# Patient Record
Sex: Female | Born: 1945 | Race: White | Hispanic: No | Marital: Married | Smoking: Never smoker
Health system: Southern US, Community
[De-identification: ages and names within clinical notes are randomized; demographics above are authoritative.]

## PROBLEM LIST (undated history)

## (undated) DIAGNOSIS — M1990 Unspecified osteoarthritis, unspecified site: Secondary | ICD-10-CM

## (undated) DIAGNOSIS — D759 Disease of blood and blood-forming organs, unspecified: Secondary | ICD-10-CM

## (undated) DIAGNOSIS — R079 Chest pain, unspecified: Secondary | ICD-10-CM

## (undated) DIAGNOSIS — C801 Malignant (primary) neoplasm, unspecified: Secondary | ICD-10-CM

## (undated) DIAGNOSIS — E039 Hypothyroidism, unspecified: Secondary | ICD-10-CM

## (undated) HISTORY — PX: CATARACT EXTRACTION, BILATERAL: SHX1313

## (undated) HISTORY — PX: JOINT REPLACEMENT: SHX530

## (undated) HISTORY — PX: TUBAL LIGATION: SHX77

## (undated) HISTORY — PX: MELANOMA EXCISION: SHX5266

---

## 1978-06-20 DIAGNOSIS — G039 Meningitis, unspecified: Secondary | ICD-10-CM

## 1978-06-20 HISTORY — DX: Meningitis, unspecified: G03.9

## 2012-10-24 ENCOUNTER — Telehealth: Payer: Self-pay | Admitting: Oncology

## 2012-10-24 NOTE — Telephone Encounter (Signed)
S/W PT IN RE TO NP APPT 06/23 @ 2 W/DR. MAGRINAT REFERRING DR. Windy Fast GIOFFRE WELCOME PACKET MAILED.

## 2012-10-24 NOTE — Telephone Encounter (Signed)
PER MD SCHEDULED PT 06/23 @ 2.

## 2012-10-26 ENCOUNTER — Telehealth: Payer: Self-pay | Admitting: Oncology

## 2012-10-26 NOTE — Telephone Encounter (Signed)
C/D 10/26/12 for appt. 12/10/12

## 2012-10-27 ENCOUNTER — Other Ambulatory Visit: Payer: Self-pay | Admitting: Oncology

## 2012-10-27 DIAGNOSIS — D472 Monoclonal gammopathy: Secondary | ICD-10-CM

## 2012-10-30 ENCOUNTER — Telehealth: Payer: Self-pay | Admitting: *Deleted

## 2012-10-30 NOTE — Telephone Encounter (Signed)
Pt left message with this RN stating desire to obtain labs if possible at facility in [place]. Timmy left names of 3 facilities - [facility], her primary MD- DR Junious Dresser and [facility].  Arrangements made for labs to be drawn at [facility] on [address]/ ph# [phone], fax# [phone].

## 2012-12-05 ENCOUNTER — Other Ambulatory Visit: Payer: Self-pay | Admitting: Lab

## 2012-12-05 ENCOUNTER — Telehealth: Payer: Self-pay | Admitting: *Deleted

## 2012-12-05 NOTE — Telephone Encounter (Signed)
Received call from Dardanelle @ [facility] in [place] needing clarifications again  on lab orders as pt is waiting at the lab today.  Katharine Look all lab orders as scheduled in EPIC.  Lisa's phone  [phone].

## 2012-12-10 ENCOUNTER — Encounter: Payer: Self-pay | Admitting: Oncology

## 2012-12-10 ENCOUNTER — Other Ambulatory Visit: Payer: Self-pay | Admitting: Lab

## 2012-12-10 ENCOUNTER — Ambulatory Visit (HOSPITAL_BASED_OUTPATIENT_CLINIC_OR_DEPARTMENT_OTHER): Payer: Medicare Other

## 2012-12-10 ENCOUNTER — Telehealth: Payer: Self-pay | Admitting: Oncology

## 2012-12-10 ENCOUNTER — Ambulatory Visit (HOSPITAL_BASED_OUTPATIENT_CLINIC_OR_DEPARTMENT_OTHER): Payer: Medicare Other | Admitting: Oncology

## 2012-12-10 VITALS — BP 126/74 | HR 52 | Temp 97.8°F | Resp 20 | Ht 61.0 in | Wt 155.7 lb

## 2012-12-10 DIAGNOSIS — D472 Monoclonal gammopathy: Secondary | ICD-10-CM | POA: Insufficient documentation

## 2012-12-10 NOTE — Progress Notes (Signed)
ID: Madison Alvarez OB: 18-Apr-1946  MR#: [id number]  CSN#:[id number]  PCP: No primary provider on file. GYN:  Madison Alvarez SU:  OTHER MD: Madison Alvarez (PCP), Madison Alvarez,   HISTORY OF PRESENT ILLNESS: Madison Alvarez noted left hip pain sometime in January of 2014. This became progressively worse, to the point that she was "limping everywhere". She brought this to the attention of her Alvarez-in-law, who is a [place], but he was not able to relieve the problem and suggested some films. She then was evaluated by Dr. Tinnie Gens who obtained a left hip MRI and bone scan. The left hip MRI showed a nondisplaced subchondral stress fracture of the left femoral head with mild to moderate marrow edema. There was moderate chronic arthritis of both hips and incidentally sigmoid diverticulosis was also noted. As part of the evaluation lab work was obtained and this showed an M spike of 0.46 g/dL, identified as IgG lambda. Other labs obtained at that time showed a total IgG of 1180, total IgA of 171, and total IgM of 93, all normal. Hemoglobin was 13.0, MCV 87.2, creatinine 0.72 and calcium 9.4. Albumin was 4.2 and total protein 7.0. Again all these lab work was unremarkable.  Given the identification of an M spike and the patient was referred for further evaluation  INTERVAL HISTORY: Madison Alvarez was seen at the cancer clinic 12/10/2012 accompanied by her husband Madison Alvarez  REVIEW OF SYSTEMS: Her hip pain gradually got better over the last 6 weeks and now it doesn't hurt. She has however not started back on a regular exercise program. She has had no fevers, rash, bleeding, unexplained fatigue or unexplained weight loss. A detailed review of systems today was entirely benign  PAST MEDICAL HISTORY: No past medical history on file. She has a remote history of meningitis and carries a diagnosis of osteoporosis  PAST SURGICAL HISTORY: No past surgical history on file. Melanoma removed from the left hip skin area approximately  2005  FAMILY HISTORY No family history on file. The patient's father committed suicide when the patient was 67 years old. The patient's mother died from complications of epilepsy her in her late 81s. The patient has 2 brothers, one sister. There is no history of cancer in the family.  GYNECOLOGIC HISTORY:  Menarche age 67, first live birth age 67, the patient is GX P2. She stopped having periods approximately 2006. She never took hormone replacement  SOCIAL HISTORY:  Madison Alvarez works at "[employer]" in [place] she has 2 children from her first marriage Madison Alvarez who lives in [facility] [place] and works as a Cabin crew, also living in [place], who works in Administrator, arts. The patient has 3 grandchildren. Madison Alvarez has 2 children from a prior marriage a Alvarez living in [place] and a Alvarez living in [place]. Madison Alvarez has 2 grandchildren of his own. The Kains are not church attenders    ADVANCED DIRECTIVES:    HEALTH MAINTENANCE: History  Substance Use Topics  . Smoking status: Not on file  . Smokeless tobacco: Not on file  . Alcohol Use: Not on file     Colonoscopy: 2013  PAP: Due  Bone density: 2012, showing "early osteoporosis"  Lipid panel:  Allergies not on file  No current outpatient prescriptions on file.   No current facility-administered medications for this visit.    OBJECTIVE:  Middle-aged white woman in no acute distress Filed Vitals:   12/10/12 1420  BP: 126/74  Pulse: 52  Temp: 97.8 F (36.6 C)  Resp:  20     Body mass index is 29.43 kg/(m^2).    ECOG FS: 0  Sclerae unicteric Oropharynx clear No cervical or supraclavicular adenopathy Lungs no rales or rhonchi Heart regular rate and rhythm Abd benign MSK no focal spinal tenderness, no peripheral edema; specifically no pain to palpation or percussion over the left hip area Neuro: non-focal, well-oriented, appropriate affect Breasts: Deferred   LAB RESULTS:  CMP  No results found for this basename:  na, k, cl, co2, glucose, bun, creatinine, calcium, prot, albumin, ast, alt, alkphos, bilitot, gfrnonaa, gfraa    I No results found for this basename: SPEP, UPEP,  kappa and lambda light chains    No results found for this basename: WBC, NEUTROABS, HGB, HCT, MCV, PLT      Chemistry   No results found for this basename: NA, K, CL, CO2, BUN, CREATININE, GLU   No results found for this basename: CALCIUM, ALKPHOS, AST, ALT, BILITOT       No results found for this basename: LABCA2    No components found with this basename: LABCA125    No results found for this basename: INR,  in the last 168 hours  Urinalysis No results found for this basename: colorurine, appearanceur, labspec, phurine, glucoseu, hgbur, bilirubinur, ketonesur, proteinur, urobilinogen, nitrite, leukocytesur    STUDIES: No results found.  ASSESSMENT: 67 y.o. [place] woman with a 0.46 g/dL serum monoclonal spike, IgG lambda, documented 10/01/2012 in the absence of urine paraproteinemia, anemia, hypercalcemia, renal injury or lytic bone leasions  PLAN: We spent the better part of today's hour-long visit going over the biology of plasma cells and the difference between monoclonal gammopathy of uncertain significance and multiple myeloma. Madison Alvarez has a very small M.-spike in the absence of any evidence of an organ damage. 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  I have set her up for lab work in October, February, and June, and she will see me on a yearly basis a week or so after her June lab work. We will do the labs in [place] to save her the trip to [place]. She has access to "my chart" and therefore will have her results available, but will call with any questions. After the first year we will brought in the followup interval to every 6 months.   Lowella Dell, MD   12/10/2012 3:25 PM

## 2012-12-10 NOTE — Progress Notes (Signed)
No living will/POA-- updated email addres. No financial issues at check in of new patient.

## 2012-12-20 ENCOUNTER — Encounter: Payer: Self-pay | Admitting: Oncology

## 2012-12-20 ENCOUNTER — Other Ambulatory Visit: Payer: Self-pay | Admitting: Oncology

## 2013-01-22 ENCOUNTER — Encounter: Payer: Self-pay | Admitting: Orthopedic Surgery

## 2013-04-09 ENCOUNTER — Other Ambulatory Visit: Payer: Self-pay | Admitting: Oncology

## 2013-04-23 ENCOUNTER — Encounter: Payer: Self-pay | Admitting: Oncology

## 2013-09-15 ENCOUNTER — Other Ambulatory Visit: Payer: Self-pay | Admitting: Oncology

## 2013-09-17 ENCOUNTER — Telehealth: Payer: Self-pay | Admitting: Oncology

## 2013-09-17 NOTE — Telephone Encounter (Signed)
, °

## 2013-09-18 ENCOUNTER — Telehealth: Payer: Self-pay | Admitting: *Deleted

## 2013-09-18 ENCOUNTER — Other Ambulatory Visit: Payer: Self-pay | Admitting: Oncology

## 2013-09-18 NOTE — Telephone Encounter (Signed)
Faxed orders to [facility] for lab draw in August 2015 for MD visit here in September.

## 2013-12-09 ENCOUNTER — Encounter: Payer: Medicare Other | Admitting: Oncology

## 2014-02-07 ENCOUNTER — Other Ambulatory Visit: Payer: Self-pay | Admitting: *Deleted

## 2014-02-10 ENCOUNTER — Other Ambulatory Visit: Payer: Self-pay | Admitting: *Deleted

## 2014-02-10 ENCOUNTER — Telehealth: Payer: Self-pay | Admitting: *Deleted

## 2014-02-10 NOTE — Progress Notes (Signed)
This RN spoke with pt on Friday 8/21 per her concern " if right labs were drawn when I got them done earlier this week "  Per discussion- Tiphany states " when I was having them drawn the tech was asking me about keppra dose - which I told her I wasn't taking - so I asked about my lab orders and she said they were not the same as what I had done previously "  This RN called to [facility] in [place] at [phone] Per Legrand Com[place]- labs that were drawn were done on 8/19 at [facility] had Kappa/lambda and SPEP.  Labs will be faxed to this RN.

## 2014-02-10 NOTE — Telephone Encounter (Signed)
Charting error.

## 2014-02-12 ENCOUNTER — Other Ambulatory Visit: Payer: Medicare Other

## 2014-02-13 ENCOUNTER — Other Ambulatory Visit: Payer: Self-pay | Admitting: Oncology

## 2014-02-19 ENCOUNTER — Ambulatory Visit (HOSPITAL_BASED_OUTPATIENT_CLINIC_OR_DEPARTMENT_OTHER): Payer: Medicare Other | Admitting: Oncology

## 2014-02-19 ENCOUNTER — Telehealth: Payer: Self-pay | Admitting: Oncology

## 2014-02-19 VITALS — BP 119/69 | HR 61 | Temp 98.4°F | Resp 18 | Ht 61.0 in

## 2014-02-19 DIAGNOSIS — D649 Anemia, unspecified: Secondary | ICD-10-CM

## 2014-02-19 DIAGNOSIS — D472 Monoclonal gammopathy: Secondary | ICD-10-CM

## 2014-02-19 NOTE — Telephone Encounter (Signed)
, °

## 2014-02-19 NOTE — Progress Notes (Signed)
ID: Madison Alvarez OB: 1946/03/28  MR#: [id number]  CSN#:[id number]  PCP: Madison Alvarez., MD GYN:  Madison Alvarez SU:  OTHER MD: Madison Alvarez (PCP), Madison Alvarez,   HISTORY OF PRESENT ILLNESS: From the original intent not:  Madison Alvarez noted left hip pain sometime in January of 2014. This became progressively worse, to the point that she was "limping everywhere". She brought this to the attention of her son-in-law, who is a Restaurant manager, fast food, but he was not able to relieve the problem and suggested some films. She then was evaluated by Madison Alvarez who obtained a left hip MRI and bone scan. The left hip MRI showed a nondisplaced subchondral stress fracture of the left femoral head with mild to moderate marrow edema. There was moderate chronic arthritis of both hips and incidentally sigmoid diverticulosis was also noted. As part of the evaluation lab work was obtained and this showed an M spike of 0.46 g/dL, identified as IgG lambda. Other labs obtained at that time showed a total IgG of 1180, total IgA of 171, and total IgM of 93, all normal. Hemoglobin was 13.0, MCV 87.2, creatinine 0.72 and calcium 9.4. Albumin was 4.2 and total protein 7.0. Again all these lab work was unremarkable.  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 She continues to have problems. She has dealt with this by taking indomethacin and by restricting her activity. Of course that only makes the arthritis worse if that is what it is, as I believe. Aside from this issue, she is having some dyspareunia and vaginal dryness. A detailed review of systems today was otherwise noncontributory  PAST MEDICAL HISTORY: No past medical history on file. She has a remote history of meningitis and  carries a diagnosis of osteoporosis  PAST SURGICAL HISTORY: No past surgical history on file. Melanoma removed from the left hip skin area approximately 2005  FAMILY HISTORY No family history on file. The patient's father committed suicide when the patient was 68 years old. The patient's mother died from complications of epilepsy her in her late 78s. The patient has 2 brothers, one sister. There is no history of cancer in the family.  GYNECOLOGIC HISTORY:  Menarche age 43, first live birth age 28, the patient is Madison Alvarez P2. She stopped having periods approximately 2006. She never took hormone replacement  SOCIAL HISTORY:  Madison Alvarez works at "[employer]" in [place] she has 2 children from her first marriage Madison Alvarez who lives in [place] and works as a Warden/ranger, also living in [place], who works in Health and safety inspector. The patient has 3 grandchildren. Madison Alvarez has 2 children from a prior marriage a daughter living in [place] and a son living in [place]. Madison Alvarez has 2 grandchildren of his own. The Kains are not church attenders    ADVANCED DIRECTIVES:    HEALTH MAINTENANCE: History  Substance Use Topics  . Smoking status: Not on file  . Smokeless tobacco: Not on file  . Alcohol Use: Not on file     Colonoscopy: 2013  PAP: Due  Bone density: 2012, showing "early osteoporosis"  Lipid panel:  Allergies not on file  No current outpatient prescriptions on file.   No current facility-administered medications for this visit.  OBJECTIVE:  Middle-aged white woman who appears well  Filed Vitals:   02/19/14 1513  BP: 119/69  Pulse: 61  Temp: 98.4 F (36.9 C)  Resp: 18     There is no weight on file to calculate BMI.    ECOG FS: 1  Sclerae unicteric, pupils round and reactive  Oropharynx clear and moist  No cervical or supraclavicular adenopathy Lungs no rales or rhonchi Heart regular rate and rhythm Abd  soft, nontender, positive bowel sounds  MSK no focal spinal  tenderness, no  ankle or lower extremity  edema;  straight-leg raise on the right only to 30, to 50 on the left  Neuro: non-focal, well-oriented, appropriate affect Breasts: Deferred   LAB RESULTS:  CMP  No results found for this basename: na,  k,  cl,  co2,  glucose,  bun,  creatinine,  calcium,  prot,  albumin,  ast,  alt,  alkphos,  bilitot,  gfrnonaa,  gfraa    I No results found for this basename: SPEP,  UPEP,   kappa and lambda light chains    No results found for this basename: WBC,  NEUTROABS,  HGB,  HCT,  MCV,  PLT      Chemistry   No results found for this basename: NA,  K,  CL,  CO2,  BUN,  CREATININE,  GLU   No results found for this basename: CALCIUM,  ALKPHOS,  AST,  ALT,  BILITOT       No results found for this basename: LABCA2    No components found with this basename: LABCA125    No results found for this basename: INR,  in the last 168 hours  Urinalysis No results found for this basename: colorurine,  appearanceur,  labspec,  phurine,  glucoseu,  hgbur,  bilirubinur,  ketonesur,  proteinur,  urobilinogen,  nitrite,  leukocytesur    STUDIES: Verbal report from [facility] lab tells me that the lambda light chains are 28.65 and the M spike 0.42 weeks ago. Their faxing the report. Of course that would indicate no change  ASSESSMENT: 68 y.o. [place] woman with a 0.46 g/dL serum monoclonal spike, IgG lambda, documented 10/01/2012 in the absence of urine paraproteinemia, anemia, hypercalcemia, renal injury or lytic bone leasions  PLAN: Madison Alvarez is doing well as far as her MGUS is concerned. This requires only followup. I wrote for her to have her labs drawn again in March and late August of next year and she will see me mid September of next year. She is having significant arthritis involving the hips. This has been evaluated by Madison Alvarez. I don't think this is a surgical issue but I also don't think just taking nonsteroidals is going to be of much  help. Instead of limiting her activity I think she should get into a range of motion program for she will very very gradually and very safely but very relentlessly continued to put her hips Collie Siad there range of motion and particularly abduction. I gave her a pamphlet on the [place] program, which may be of help, since she would be able to use the water aerobics as well as have a trainer for free. Hopefully by the time she sees me next year this problem will be better.  She did also complained of dyspareunia. I gave her a pamphlet on our intimacy and pelvic health program.  Otherwise she will return to see me in one year. She knows to call for any problems that may develop before the  next visit   Chauncey Cruel, MD   02/19/2014 3:14 PM

## 2014-02-25 ENCOUNTER — Encounter: Payer: Self-pay | Admitting: Oncology

## 2014-03-14 ENCOUNTER — Encounter: Payer: Self-pay | Admitting: Oncology

## 2014-09-01 ENCOUNTER — Telehealth: Payer: Self-pay | Admitting: *Deleted

## 2014-09-01 NOTE — Telephone Encounter (Signed)
Ignacia Palma at [facility] called to say that the patient was there for labs, but they did not have the ICD-10 codes.  Provided her the code.

## 2014-09-06 ENCOUNTER — Other Ambulatory Visit: Payer: Self-pay | Admitting: Oncology

## 2014-09-23 ENCOUNTER — Encounter: Payer: Self-pay | Admitting: Oncology

## 2015-03-02 ENCOUNTER — Telehealth: Payer: Self-pay | Admitting: Oncology

## 2015-03-02 ENCOUNTER — Ambulatory Visit (HOSPITAL_BASED_OUTPATIENT_CLINIC_OR_DEPARTMENT_OTHER): Payer: PPO | Admitting: Oncology

## 2015-03-02 VITALS — BP 144/79 | HR 57 | Temp 98.5°F | Resp 18 | Ht 61.0 in | Wt 118.1 lb

## 2015-03-02 DIAGNOSIS — M160 Bilateral primary osteoarthritis of hip: Secondary | ICD-10-CM

## 2015-03-02 DIAGNOSIS — M1990 Unspecified osteoarthritis, unspecified site: Secondary | ICD-10-CM | POA: Insufficient documentation

## 2015-03-02 DIAGNOSIS — D472 Monoclonal gammopathy: Secondary | ICD-10-CM

## 2015-03-02 NOTE — Telephone Encounter (Signed)
Appointments made and avs printed for patient °

## 2015-03-02 NOTE — Progress Notes (Signed)
ID: [name] OB: 22-Dec-1945  MR#: [id number]  FIE#:[id number]  PCP: [name]., MD GYN:  Kimberlee Nearing SU:  OTHER MD: Jenean Lindau (PCP), Carmon Sails,   HISTORY OF PRESENT ILLNESS: From the original intent not:  Madison Alvarez noted left hip pain sometime in January of 2014. This became progressively worse, to the point that she was "limping everywhere". She brought this to the attention of her son-in-law, who is a Restaurant manager, fast food, but he was not able to relieve the problem and suggested some films. She then was evaluated by Dr. Dellis Filbert who obtained a left hip MRI and bone scan. The left hip MRI showed a nondisplaced subchondral stress fracture of the left femoral head with mild to moderate marrow edema. There was moderate chronic arthritis of both hips and incidentally sigmoid diverticulosis was also noted. As part of the evaluation lab work was obtained and this showed an M spike of 0.46 g/dL, identified as IgG lambda. Other labs obtained at that time showed a total IgG of 1180, total IgA of 171, and total IgM of 93, all normal. Hemoglobin was 13.0, MCV 87.2, creatinine 0.72 and calcium 9.4. Albumin was 4.2 and total protein 7.0. Again all these lab work was unremarkable.  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 The interval history continues to be dominated by her hip pain. However her lab results remains stable, with an M spike of 0.4 in March and August, and a normal kappa/lambda ratio  At the last visit we discussed range of motion exercises, the [employer] program, and consideration of the intimacy and pelvic health program here. She actually did not follow-up on any of those recommendations.  REVIEW OF SYSTEMS:  She has significant pain in the hips bilaterally, better when she is active, worse when she sits or lies down. Sometimes at  night she has to take 3 Advil just to be able to go to sleep. Occasionally she takes a Norco, but this is very seldom, she tells me. She does not have significant pain elsewhere. She does tell me she bruises easily and sometimes she has low back pain but this is more intermittent. A detailed review of systems today was otherwise stable  PAST MEDICAL HISTORY: No past medical history on file. She has a remote history of meningitis and carries a diagnosis of osteoporosis  PAST SURGICAL HISTORY: No past surgical history on file. Melanoma removed from the left hip skin area approximately 2005  FAMILY HISTORY No family history on file. The patient's father committed suicide when the patient was 69 years old. The patient's mother died from complications of epilepsy her in her late 81s. The patient has 2 brothers, one sister. There is no history of cancer in the family.  GYNECOLOGIC HISTORY:  Menarche age 35, first live birth age 55, the patient is [place] P2. She stopped having periods approximately 2006. She never took hormone replacement  SOCIAL HISTORY:  Madison Alvarez works at "[employer]" in [place] she has 2 children from her first marriage Ronalee Belts who lives in [place] and works as a Warden/ranger, also living in [place], who works in Health and safety inspector. The patient has 3 grandchildren. [name] has 2 children from a prior marriage a daughter living in [place] and a son living in [place]. [name] has 2 grandchildren of his own. The [place] are not church  attenders    ADVANCED DIRECTIVES: Not in place   HEALTH MAINTENANCE: Social History  Substance Use Topics  . Smoking status: Not on file  . Smokeless tobacco: Not on file  . Alcohol Use: Not on file     Colonoscopy: 2013  PAP: Due  Bone density: 2012, showing "early osteoporosis"  Lipid panel:  Allergies not on file  No current outpatient prescriptions on file.   No current facility-administered medications for this visit.     OBJECTIVE:  Middle-aged white woman who appears stated age 4 Vitals:   03/02/15 1341  BP: 144/79  Pulse: 57  Temp: 98.5 F (36.9 C)  Resp: 18     Body mass index is 22.33 kg/(m^2).    ECOG FS: 1  Sclerae unicteric, EOMs intact Oropharynx clear, slightly dry No cervical or supraclavicular adenopathy Lungs no rales or rhonchi Heart regular rate and rhythm Abd soft, nontender, positive bowel sounds MSK no focal spinal tenderness, straight leg raise bilaterally to about 70, with some difficulty; very painful figure of 4 Neuro: nonfocal, well oriented, appropriate affect Breasts: Deferred   LAB RESULTS: Repeat M spike 09/02/2014 at [facility] was stable at 0.4 g/dL and again 02/02/2015. She met obtained the same day showed a creatinine of 0.70 normal liver function tests and unremarkable electrolytes. CBC showed a white cell count of 6.2 hemoglobin 13.2 and platelets 226,000. The kappa lambda ratio was normal at 0.55  CMP  No results found for: NA  I No results found for: SPEP  No results found for: WBC    Chemistry   No results found for: NA No results found for: CALCIUM     No results found for: LABCA2  No components found for: [id number]  No results for input(s): INR in the last 168 hours.  Urinalysis No results found for: COLORURINE  STUDIES: MRI of the right hip 02/10/2011 at [facility] should a stress fracture of the left femoral head, moderate chronic arthritis of both hips with degeneration and tear of the labrum, small bilateral hip joint effusions, and multiple bilateral hamstring tendinosis  ASSESSMENT: 69 y.o. [place] woman with a 0.46 g/dL serum monoclonal spike, IgG lambda, documented 10/01/2012 in the absence of urine paraproteinemia, anemia, hypercalcemia, renal injury or lytic bone leasions  (1) significant osteoarthritis chiefly involving hips  PLAN:  I spent approximately 35 minutes today with Ivin Booty and her husband going  over her situation. First I reassured her that her paraprotein is not related to the symptoms she is having. This is generally a benign finding which requires only follow-up. It is not related to the hip pain that she has been experiencing 4 years  We then talked about the different kinds of arthritis. She seems to have a fairly clear-cut case of osteoarthritis as documented by the prior hip MRI. This looking at her hands also show significant osteoarthritic changes. This is not suggestive of rheumatoid arthritis for example  If this is osteoarthritis then it is not going to get better a less she persistently does range of motion exercises. She is not doing them now because she is afraid of injuring herself. This is a reasonable concern. Accordingly, since she already is a member of the [employer], the best thing for her to do is to obtain a trainer and start slow and easy but then move up as tolerated. I think if she did that 3 times a week next year when she sees me she will have considerably improved range of motion.  I also suggested she see Dr. Dennie Maizes again.  That in turn can help her osteoporosis. I discuss bisphosphonates and denosumab with her. At this point she refuses all those interventions but would like to optimize weightbearing exercise, which of course is going to be difficult unless she gets a handle on the osteoporosis.  Finally we discussed how to get her labs checked. She comes to [facility] but once a month. It would be relatively easy for her to give a call on one of those days and let us know that she is available for lab work. Especially in the afternoon we frequently can work people and without difficulty.  Accordingly as far as her paraprotein is concerned we will check labs inferior March and then again in August. She will see me again next September.  Chauncey Cruel, MD   03/02/2015 1:54 PM

## 2015-03-31 ENCOUNTER — Encounter: Payer: Self-pay | Admitting: Oncology

## 2015-05-11 ENCOUNTER — Other Ambulatory Visit: Payer: Self-pay | Admitting: Orthopedic Surgery

## 2015-05-11 DIAGNOSIS — M48061 Spinal stenosis, lumbar region without neurogenic claudication: Secondary | ICD-10-CM

## 2015-05-25 ENCOUNTER — Ambulatory Visit
Admission: RE | Admit: 2015-05-25 | Discharge: 2015-05-25 | Disposition: A | Discharge status: Home / Self Care | Payer: PPO | Source: Ambulatory Visit | Attending: Orthopedic Surgery | Admitting: Orthopedic Surgery

## 2015-05-25 DIAGNOSIS — M48061 Spinal stenosis, lumbar region without neurogenic claudication: Secondary | ICD-10-CM

## 2015-05-25 MED ORDER — DIAZEPAM 5 MG PO TABS
5.0000 mg | ORAL_TABLET | Freq: Once | ORAL | Status: AC
  Administered 2015-05-25: 5 mg via ORAL

## 2015-05-25 MED ORDER — IOHEXOL 180 MG/ML  SOLN
15.0000 mL | Freq: Once | INTRAMUSCULAR | Status: AC | PRN
  Administered 2015-05-25: 15 mL via INTRATHECAL

## 2015-05-25 NOTE — Discharge Instructions (Signed)

## 2015-07-13 DIAGNOSIS — M25551 Pain in right hip: Secondary | ICD-10-CM | POA: Diagnosis not present

## 2015-07-13 DIAGNOSIS — M4806 Spinal stenosis, lumbar region: Secondary | ICD-10-CM | POA: Diagnosis not present

## 2015-07-24 DIAGNOSIS — Z6822 Body mass index (BMI) 22.0-22.9, adult: Secondary | ICD-10-CM | POA: Diagnosis not present

## 2015-07-24 DIAGNOSIS — J111 Influenza due to unidentified influenza virus with other respiratory manifestations: Secondary | ICD-10-CM | POA: Diagnosis not present

## 2015-08-05 DIAGNOSIS — M1611 Unilateral primary osteoarthritis, right hip: Secondary | ICD-10-CM | POA: Diagnosis not present

## 2015-08-05 DIAGNOSIS — Z6822 Body mass index (BMI) 22.0-22.9, adult: Secondary | ICD-10-CM | POA: Diagnosis not present

## 2015-08-05 DIAGNOSIS — R430 Anosmia: Secondary | ICD-10-CM | POA: Diagnosis not present

## 2015-08-06 DIAGNOSIS — R430 Anosmia: Secondary | ICD-10-CM | POA: Diagnosis not present

## 2015-08-06 DIAGNOSIS — R432 Parageusia: Secondary | ICD-10-CM | POA: Diagnosis not present

## 2015-08-23 NOTE — H&P (Signed)
TOTAL HIP ADMISSION H&P  Patient is admitted for right total hip arthroplasty, anterior approach.  Subjective:  Chief Complaint:    Right hip primary OA / pain  HPI: Madison Alvarez, 70 y.o. female, has a history of pain and functional disability in the right hip(s) due to arthritis and patient has failed non-surgical conservative treatments for greater than 12 weeks to include NSAID's and/or analgesics and activity modification.  Onset of symptoms was gradual starting 4+ years ago with gradually worsening course since that time.The patient noted no past surgery on the right hip(s).  Patient currently rates pain in the left hip at 9 out of 10 with activity. Patient has night pain, worsening of pain with activity and weight bearing, trendelenberg gait, pain that interfers with activities of daily living and pain with passive range of motion. Patient has evidence of periarticular osteophytes and joint space narrowing by imaging studies. This condition presents safety issues increasing the risk of falls.   There is no current active infection.   Risks, benefits and expectations were discussed with the patient.  Risks including but not limited to the risk of anesthesia, blood clots, nerve damage, blood vessel damage, failure of the prosthesis, infection and up to and including death.  Patient understand the risks, benefits and expectations and wishes to proceed with surgery.   PCP: [name]., MD  D/C Plans:      Home  Post-op Meds:       No Rx given  Tranexamic Acid:      To be given - IV   Decadron:      Is to be given  FYI:     ASA  Norco     Patient Active Problem List   Diagnosis Date Noted  . Osteoarthritis 03/02/2015  . Monoclonal paraproteinemia 12/10/2012   Past Medical History  Diagnosis Date  . Cancer [facility])     Melanoma '02- excised left hip-  . Hypothyroidism   . Chest pain     7-8 yrs ago -all heart tests negative for heart issues  . Arthritis      osteoarthritis-hips, hands. history of vertebral cyst(causes numbness intermittent down legs)  . Blood dyscrasia     Dr. Jana Hakim follows"says nothing to be concerned about"    Past Surgical History  Procedure Laterality Date  . Cataract extraction, bilateral    . Melanoma excision Left     '02- left hip  . Tubal ligation      No prescriptions prior to admission   Allergies  Allergen Reactions  . Iodinated Diagnostic Agents Hives    1980's   . Penicillins Hives and Swelling    .Marland KitchenHas patient had a PCN reaction causing immediate rash, facial/tongue/throat swelling, SOB or lightheadedness with hypotension: Yes Has patient had a PCN reaction causing severe rash involving mucus membranes or skin necrosis: No Has patient had a PCN reaction that required hospitalization No Has patient had a PCN reaction occurring within the last 10 years: No If all of the above answers are "NO", then may proceed with Cephalosporin use.     Social History  Substance Use Topics  . Smoking status: Never Smoker   . Smokeless tobacco: Never Used  . Alcohol Use: Yes     Comment: social        Review of Systems  Constitutional: Negative.   HENT: Negative.   Eyes: Negative.   Respiratory: Negative.   Cardiovascular: Negative.   Gastrointestinal: Negative.   Genitourinary: Negative.  Musculoskeletal: Positive for back pain and joint pain.  Skin: Negative.   Neurological: Negative.   Endo/Heme/Allergies: Negative.   Psychiatric/Behavioral: Negative.     Objective:  Physical Exam  Constitutional: She is oriented to person, place, and time. She appears well-developed.  HENT:  Head: Normocephalic.  Eyes: Pupils are equal, round, and reactive to light.  Neck: Neck supple. No JVD present. No tracheal deviation present. No thyromegaly present.  Cardiovascular: Normal rate, regular rhythm, normal heart sounds and intact distal pulses.   Respiratory: Effort normal and breath sounds normal. No  stridor. No respiratory distress. She has no wheezes.  GI: Soft. There is no tenderness. There is no guarding.  Musculoskeletal:       Right hip: She exhibits decreased range of motion, decreased strength, tenderness and bony tenderness. She exhibits no swelling, no deformity and no laceration.  Lymphadenopathy:    She has no cervical adenopathy.  Neurological: She is alert and oriented to person, place, and time. A sensory deficit (Numbness of a couple toes on the left foot from a previous fx) is present.  Skin: Skin is warm and dry.  Psychiatric: She has a normal mood and affect.     Labs:  Estimated body mass index is 22.33 kg/(m^2) as calculated from the following:   Height as of 03/02/15: 5\' 1"  (1.549 m).   Weight as of 03/02/15: 53.57 kg (118 lb 1.6 oz).   Imaging Review Plain radiographs demonstrate severe degenerative joint disease of the right hip(s). The bone quality appears to be good for age and reported activity level.  Assessment/Plan:  End stage arthritis, right hip(s)  The patient history, physical examination, clinical judgement of the provider and imaging studies are consistent with end stage degenerative joint disease of the right hip(s) and total hip arthroplasty is deemed medically necessary. The treatment options including medical management, injection therapy, arthroscopy and arthroplasty were discussed at length. The risks and benefits of total hip arthroplasty were presented and reviewed. The risks due to aseptic loosening, infection, stiffness, dislocation/subluxation,  thromboembolic complications and other imponderables were discussed.  The patient acknowledged the explanation, agreed to proceed with the plan and consent was signed. Patient is being admitted for inpatient treatment for surgery, pain control, PT, OT, prophylactic antibiotics, VTE prophylaxis, progressive ambulation and ADL's and discharge planning.The patient is planning to be discharged  home.      Madison Pugh [place] Hudman   PA-C  08/25/2015, 9:40 AM

## 2015-08-25 ENCOUNTER — Encounter (HOSPITAL_COMMUNITY): Payer: Self-pay

## 2015-08-25 ENCOUNTER — Encounter (HOSPITAL_COMMUNITY)
Admission: RE | Admit: 2015-08-25 | Discharge: 2015-08-25 | Disposition: A | Discharge status: Home / Self Care | Payer: PPO | Source: Ambulatory Visit | Attending: Orthopedic Surgery | Admitting: Orthopedic Surgery

## 2015-08-25 DIAGNOSIS — M1611 Unilateral primary osteoarthritis, right hip: Secondary | ICD-10-CM | POA: Diagnosis not present

## 2015-08-25 DIAGNOSIS — Z0183 Encounter for blood typing: Secondary | ICD-10-CM | POA: Insufficient documentation

## 2015-08-25 DIAGNOSIS — Z01812 Encounter for preprocedural laboratory examination: Secondary | ICD-10-CM | POA: Diagnosis not present

## 2015-08-25 HISTORY — DX: Disease of blood and blood-forming organs, unspecified: D75.9

## 2015-08-25 HISTORY — DX: Unspecified osteoarthritis, unspecified site: M19.90

## 2015-08-25 HISTORY — DX: Chest pain, unspecified: R07.9

## 2015-08-25 HISTORY — DX: Hypothyroidism, unspecified: E03.9

## 2015-08-25 HISTORY — DX: Malignant (primary) neoplasm, unspecified: C80.1

## 2015-08-25 LAB — BASIC METABOLIC PANEL
Anion gap: 6 (ref 5–15)
BUN: 16 mg/dL (ref 6–20)
CHLORIDE: 109 mmol/L (ref 101–111)
CO2: 28 mmol/L (ref 22–32)
CREATININE: 0.65 mg/dL (ref 0.44–1.00)
Calcium: 9.3 mg/dL (ref 8.9–10.3)
GFR calc non Af Amer: >60 mL/min (ref >60)
Glucose, Bld: 85 mg/dL (ref 65–99)
Potassium: 4.7 mmol/L (ref 3.5–5.1)
Sodium: 143 mmol/L (ref 135–145)

## 2015-08-25 LAB — URINALYSIS, ROUTINE W REFLEX MICROSCOPIC
BILIRUBIN URINE: NEGATIVE
Glucose, UA: NEGATIVE mg/dL
Hgb urine dipstick: NEGATIVE
KETONES UR: NEGATIVE mg/dL
Leukocytes, UA: NEGATIVE
NITRITE: NEGATIVE
PROTEIN: NEGATIVE mg/dL
Specific Gravity, Urine: 1.006 (ref 1.005–1.030)
pH: 6 (ref 5.0–8.0)

## 2015-08-25 LAB — CBC
HCT: 42.4 % (ref 36.0–46.0)
HEMOGLOBIN: 13.7 g/dL (ref 12.0–15.0)
MCH: 29.6 pg (ref 26.0–34.0)
MCHC: 32.3 g/dL (ref 30.0–36.0)
MCV: 91.6 fL (ref 78.0–100.0)
Platelets: 237 10*3/uL (ref 150–400)
RBC: 4.63 MIL/uL (ref 3.87–5.11)
RDW: 13 % (ref 11.5–15.5)
WBC: 4.8 10*3/uL (ref 4.0–10.5)

## 2015-08-25 LAB — SURGICAL PCR SCREEN
MRSA, PCR: NEGATIVE
Staphylococcus aureus: NEGATIVE

## 2015-08-25 LAB — PROTIME-INR
INR: 1.14 (ref 0.00–1.49)
Prothrombin Time: 14.4 seconds (ref 11.6–15.2)

## 2015-08-25 LAB — ABO/RH: ABO/RH(D): A NEG

## 2015-08-25 LAB — APTT: aPTT: 27 seconds (ref 24–37)

## 2015-08-25 NOTE — Patient Instructions (Addendum)
Madison Alvarez  08/25/2015   Your procedure is scheduled on: 09-01-15  Report to [facility] Main  Entrance take [facility]  elevators to 3rd floor to  [place] at   1000 AM.  Call this number if you have problems the morning of surgery [phone]   Remember: ONLY 1 PERSON MAY GO WITH YOU TO SHORT STAY TO GET  READY MORNING OF [name]  Do not eat food or drink liquids :After Midnight.(May have a glass or two of Clear Liquid  Only 12 midnight to 0600 AM, then nothing excepts sips water with med.     Take these medicines the morning of surgery with A SIP OF WATER: Levothyroxine. DO NOT TAKE ANY DIABETIC MEDICATIONS DAY OF YOUR SURGERY                               You may not have any metal on your body including hair pins and              piercings  Do not wear jewelry, make-up, lotions, powders or perfumes, deodorant             Do not wear nail polish.  Do not shave  48 hours prior to surgery.              Men may shave face and neck.   Do not bring valuables to the hospital. [name]  Contacts, dentures or bridgework may not be worn into surgery.  Leave suitcase in the car. After surgery it may be brought to your room.     Patients discharged the day of surgery will not be allowed to drive home.  Name and phone number of your driver: Madison Alvarez -spouse  [phone]   Special Instructions: N/A              Please read over the following fact sheets you were given: _____________________________________________________________________             [facility] - Preparing for Surgery Before surgery, you can play an important role.  Because skin is not sterile, your skin needs to be as free of germs as possible.  You can reduce the number of germs on your skin by washing with CHG (chlorahexidine gluconate) soap before surgery.  CHG is an antiseptic cleaner which kills germs and bonds with the skin to  continue killing germs even after washing. Please DO NOT use if you have an allergy to CHG or antibacterial soaps.  If your skin becomes reddened/irritated stop using the CHG and inform your nurse when you arrive at Short Stay. Do not shave (including legs and underarms) for at least 48 hours prior to the first CHG shower.  You may shave your face/neck. Please follow these instructions carefully:  1.  Shower with CHG Soap the night before surgery and the  morning of Surgery.  2.  If you choose to wash your hair, wash your hair first as usual with your  normal  shampoo.  3.  After you shampoo, rinse your hair and body thoroughly to remove the  shampoo.  4.  Use CHG as you would any other liquid soap.  You can apply chg directly  to the skin and wash                       Gently with a scrungie or clean washcloth.  5.  Apply the CHG Soap to your body ONLY FROM THE NECK DOWN.   Do not use on face/ open                           Wound or open sores. Avoid contact with eyes, ears mouth and genitals (private parts).                       Wash face,  Genitals (private parts) with your normal soap.             6.  Wash thoroughly, paying special attention to the area where your surgery  will be performed.  7.  Thoroughly rinse your body with warm water from the neck down.  8.  DO NOT shower/wash with your normal soap after using and rinsing off  the CHG Soap.                9.  Pat yourself dry with a clean towel.            10.  Wear clean pajamas.            11.  Place clean sheets on your bed the night of your first shower and do not  sleep with pets. Day of Surgery : Do not apply any lotions/deodorants the morning of surgery.  Please wear clean clothes to the hospital/surgery center.  FAILURE TO FOLLOW THESE INSTRUCTIONS MAY RESULT IN THE CANCELLATION OF YOUR SURGERY PATIENT SIGNATURE_________________________________  NURSE  SIGNATURE__________________________________  ________________________________________________________________________   Madison Alvarez  An incentive spirometer is a tool that can help keep your lungs clear and active. This tool measures how well you are filling your lungs with each breath. Taking long deep breaths may help reverse or decrease the chance of developing breathing (pulmonary) problems (especially infection) following:  A long period of time when you are unable to move or be active. BEFORE THE PROCEDURE   If the spirometer includes an indicator to show your best effort, your nurse or respiratory therapist will set it to a desired goal.  If possible, sit up straight or lean slightly forward. Try not to slouch.  Hold the incentive spirometer in an upright position. INSTRUCTIONS FOR USE   Sit on the edge of your bed if possible, or sit up as far as you can in bed or on a chair.  Hold the incentive spirometer in an upright position.  Breathe out normally.  Place the mouthpiece in your mouth and seal your lips tightly around it.  Breathe in slowly and as deeply as possible, raising the piston or the ball toward the top of the column.  Hold your breath for 3-5 seconds or for as long as possible. Allow the piston or ball to fall to the bottom of the column.  Remove the mouthpiece from your mouth and breathe out normally.  Rest for a few seconds and repeat Steps 1 through 7 at least 10 times every 1-2 hours when you are awake. Take your time and take a few normal breaths between deep breaths.  The spirometer may include an indicator to show  your best effort. Use the indicator as a goal to work toward during each repetition.  After each set of 10 deep breaths, practice coughing to be sure your lungs are clear. If you have an incision (the cut made at the time of surgery), support your incision when coughing by placing a pillow or rolled up towels firmly against it. Once  you are able to get out of bed, walk around indoors and cough well. You may stop using the incentive spirometer when instructed by your caregiver.  RISKS AND COMPLICATIONS  Take your time so you do not get dizzy or light-headed.  If you are in pain, you may need to take or ask for pain medication before doing incentive spirometry. It is harder to take a deep breath if you are having pain. AFTER USE  Rest and breathe slowly and easily.  It can be helpful to keep track of a log of your progress. Your caregiver can provide you with a simple table to help with this. If you are using the spirometer at home, follow these instructions: [place] IF:   You are having difficultly using the spirometer.  You have trouble using the spirometer as often as instructed.  Your pain medication is not giving enough relief while using the spirometer.  You develop fever of 100.5 F (38.1 C) or higher. SEEK IMMEDIATE MEDICAL CARE IF:   You cough up bloody sputum that had not been present before.  You develop fever of 102 F (38.9 C) or greater.  You develop worsening pain at or near the incision site. MAKE SURE YOU:   Understand these instructions.  Will watch your condition.  Will get help right away if you are not doing well or get worse. Document Released: 10/17/2006 Document Revised: 08/29/2011 Document Reviewed: 12/18/2006 Exit[employer] Patient Information 2014 Exit[employer], [place].   ________________________________________________________________________  WHAT IS A BLOOD TRANSFUSION? Blood Transfusion Information  A transfusion is the replacement of blood or some of its parts. Blood is made up of multiple cells which provide different functions.  Red blood cells carry oxygen and are used for blood loss replacement.  White blood cells fight against infection.  Platelets control bleeding.  Plasma helps clot blood.  Other blood products are available for specialized needs, such as  hemophilia or other clotting disorders. BEFORE THE TRANSFUSION  Who gives blood for transfusions?   Healthy volunteers who are fully evaluated to make sure their blood is safe. This is blood bank blood. Transfusion therapy is the safest it has ever been in the practice of medicine. Before blood is taken from a donor, a complete history is taken to make sure that person has no history of diseases nor engages in risky social behavior (examples are intravenous drug use or sexual activity with multiple partners). The donor's travel history is screened to minimize risk of transmitting infections, such as malaria. The donated blood is tested for signs of infectious diseases, such as HIV and hepatitis. The blood is then tested to be sure it is compatible with you in order to minimize the chance of a transfusion reaction. If you or a relative donates blood, this is often done in anticipation of surgery and is not appropriate for emergency situations. It takes many days to process the donated blood. RISKS AND COMPLICATIONS Although transfusion therapy is very safe and saves many lives, the main dangers of transfusion include:   Getting an infectious disease.  Developing a transfusion reaction. This is an allergic reaction to  something in the blood you were given. Every precaution is taken to prevent this. The decision to have a blood transfusion has been considered carefully by your caregiver before blood is given. Blood is not given unless the benefits outweigh the risks. AFTER THE TRANSFUSION  Right after receiving a blood transfusion, you will usually feel much better and more energetic. This is especially true if your red blood cells have gotten low (anemic). The transfusion raises the level of the red blood cells which carry oxygen, and this usually causes an energy increase.  The nurse administering the transfusion will monitor you carefully for complications. HOME CARE INSTRUCTIONS  No special  instructions are needed after a transfusion. You may find your energy is better. Speak with your caregiver about any limitations on activity for underlying diseases you may have. SEEK MEDICAL CARE IF:   Your condition is not improving after your transfusion.  You develop redness or irritation at the intravenous (IV) site. SEEK IMMEDIATE MEDICAL CARE IF:  Any of the following symptoms occur over the next 12 hours:  Shaking chills.  You have a temperature by mouth above 102 F (38.9 C), not controlled by medicine.  Chest, back, or muscle pain.  People around you feel you are not acting correctly or are confused.  Shortness of breath or difficulty breathing.  Dizziness and fainting.  You get a rash or develop hives.  You have a decrease in urine output.  Your urine turns a dark color or changes to pink, red, or brown. Any of the following symptoms occur over the next 10 days:  You have a temperature by mouth above 102 F (38.9 C), not controlled by medicine.  Shortness of breath.  Weakness after normal activity.  The white part of the eye turns yellow (jaundice).  You have a decrease in the amount of urine or are urinating less often.  Your urine turns a dark color or changes to pink, red, or brown. Document Released: 06/03/2000 Document Revised: 08/29/2011 Document Reviewed: 01/21/2008 [facility] Patient Information 2014 [place], [place].  _______________________________________________________________________

## 2015-09-01 ENCOUNTER — Inpatient Hospital Stay (HOSPITAL_COMMUNITY): Payer: PPO

## 2015-09-01 ENCOUNTER — Inpatient Hospital Stay (HOSPITAL_COMMUNITY): Payer: PPO | Admitting: Anesthesiology

## 2015-09-01 ENCOUNTER — Encounter (HOSPITAL_COMMUNITY): Payer: Self-pay | Admitting: *Deleted

## 2015-09-01 ENCOUNTER — Encounter (HOSPITAL_COMMUNITY)
Admission: RE | Disposition: A | Discharge status: Home Health | Payer: Self-pay | Source: Ambulatory Visit | Attending: Orthopedic Surgery

## 2015-09-01 ENCOUNTER — Inpatient Hospital Stay (HOSPITAL_COMMUNITY)
Admission: RE | Admit: 2015-09-01 | Discharge: 2015-09-02 | DRG: 470 | Disposition: A | Discharge status: Home Health | Payer: PPO | Source: Ambulatory Visit | Attending: Orthopedic Surgery | Admitting: Orthopedic Surgery

## 2015-09-01 DIAGNOSIS — Z01812 Encounter for preprocedural laboratory examination: Secondary | ICD-10-CM

## 2015-09-01 DIAGNOSIS — M25551 Pain in right hip: Secondary | ICD-10-CM | POA: Diagnosis not present

## 2015-09-01 DIAGNOSIS — Z96641 Presence of right artificial hip joint: Secondary | ICD-10-CM | POA: Diagnosis not present

## 2015-09-01 DIAGNOSIS — Z85820 Personal history of malignant melanoma of skin: Secondary | ICD-10-CM

## 2015-09-01 DIAGNOSIS — M1611 Unilateral primary osteoarthritis, right hip: Principal | ICD-10-CM | POA: Diagnosis present

## 2015-09-01 DIAGNOSIS — Z471 Aftercare following joint replacement surgery: Secondary | ICD-10-CM | POA: Diagnosis not present

## 2015-09-01 DIAGNOSIS — E039 Hypothyroidism, unspecified: Secondary | ICD-10-CM | POA: Diagnosis present

## 2015-09-01 DIAGNOSIS — Z96649 Presence of unspecified artificial hip joint: Secondary | ICD-10-CM

## 2015-09-01 HISTORY — PX: TOTAL HIP ARTHROPLASTY: SHX124

## 2015-09-01 LAB — TYPE AND SCREEN
ABO/RH(D): A NEG
ANTIBODY SCREEN: NEGATIVE

## 2015-09-01 SURGERY — ARTHROPLASTY, HIP, TOTAL, ANTERIOR APPROACH
Anesthesia: Spinal | Site: Hip | Laterality: Right

## 2015-09-01 MED ORDER — BUPIVACAINE IN DEXTROSE 0.75-8.25 % IT SOLN
INTRATHECAL | Status: DC | PRN
  Administered 2015-09-01: 1.8 mL via INTRATHECAL

## 2015-09-01 MED ORDER — PROPOFOL 500 MG/50ML IV EMUL
INTRAVENOUS | Status: DC | PRN
  Administered 2015-09-01: 25 ug/kg/min via INTRAVENOUS

## 2015-09-01 MED ORDER — DEXTROSE 5 % IV SOLN
500.0000 mg | Freq: Four times a day (QID) | INTRAVENOUS | Status: DC | PRN
  Administered 2015-09-01: 500 mg via INTRAVENOUS
  Filled 2015-09-01 (×2): qty 5

## 2015-09-01 MED ORDER — ALUM & MAG HYDROXIDE-SIMETH 200-200-20 MG/5ML PO SUSP: 30.0000 mL | ORAL | Status: DC | PRN

## 2015-09-01 MED ORDER — DEXAMETHASONE SODIUM PHOSPHATE 10 MG/ML IJ SOLN
10.0000 mg | Freq: Once | INTRAMUSCULAR | Status: AC
  Administered 2015-09-02: 10 mg via INTRAVENOUS
  Filled 2015-09-01: qty 1

## 2015-09-01 MED ORDER — CEFAZOLIN SODIUM-DEXTROSE 2-3 GM-% IV SOLR
INTRAVENOUS | Status: AC
  Filled 2015-09-01: qty 50

## 2015-09-01 MED ORDER — FENTANYL CITRATE (PF) 100 MCG/2ML IJ SOLN
INTRAMUSCULAR | Status: DC | PRN
  Administered 2015-09-01: 50 ug via INTRAVENOUS

## 2015-09-01 MED ORDER — ONDANSETRON HCL 4 MG PO TABS: 4.0000 mg | ORAL_TABLET | Freq: Four times a day (QID) | ORAL | Status: DC | PRN

## 2015-09-01 MED ORDER — TRANEXAMIC ACID 1000 MG/10ML IV SOLN
1000.0000 mg | Freq: Once | INTRAVENOUS | Status: AC
  Administered 2015-09-01: 1000 mg via INTRAVENOUS
  Filled 2015-09-01: qty 10

## 2015-09-01 MED ORDER — PHENYLEPHRINE HCL 10 MG/ML IJ SOLN
INTRAMUSCULAR | Status: DC | PRN
  Administered 2015-09-01: 80 ug via INTRAVENOUS
  Administered 2015-09-01 (×2): 40 ug via INTRAVENOUS

## 2015-09-01 MED ORDER — FENTANYL CITRATE (PF) 100 MCG/2ML IJ SOLN
INTRAMUSCULAR | Status: AC
  Filled 2015-09-01: qty 2

## 2015-09-01 MED ORDER — PHENOL 1.4 % MT LIQD
1.0000 | OROMUCOSAL | Status: DC | PRN
  Filled 2015-09-01: qty 177

## 2015-09-01 MED ORDER — METOCLOPRAMIDE HCL 5 MG/ML IJ SOLN: 5.0000 mg | Freq: Three times a day (TID) | INTRAMUSCULAR | Status: DC | PRN

## 2015-09-01 MED ORDER — FENTANYL CITRATE (PF) 100 MCG/2ML IJ SOLN
25.0000 ug | INTRAMUSCULAR | Status: DC | PRN
  Administered 2015-09-01: 25 ug via INTRAVENOUS
  Administered 2015-09-01: 50 ug via INTRAVENOUS
  Administered 2015-09-01 (×2): 25 ug via INTRAVENOUS

## 2015-09-01 MED ORDER — DIPHENHYDRAMINE HCL 25 MG PO CAPS: 25.0000 mg | ORAL_CAPSULE | Freq: Four times a day (QID) | ORAL | Status: DC | PRN

## 2015-09-01 MED ORDER — HYDROCODONE-ACETAMINOPHEN 7.5-325 MG PO TABS
1.0000 | ORAL_TABLET | ORAL | Status: DC
  Administered 2015-09-01 – 2015-09-02 (×4): 1 via ORAL
  Filled 2015-09-01 (×4): qty 1

## 2015-09-01 MED ORDER — MAGNESIUM CITRATE PO SOLN: 1.0000 | Freq: Once | ORAL | Status: DC | PRN

## 2015-09-01 MED ORDER — METHOCARBAMOL 500 MG PO TABS
500.0000 mg | ORAL_TABLET | Freq: Four times a day (QID) | ORAL | Status: DC | PRN
  Administered 2015-09-02 (×2): 500 mg via ORAL
  Filled 2015-09-01 (×2): qty 1

## 2015-09-01 MED ORDER — VANCOMYCIN HCL IN DEXTROSE 1-5 GM/200ML-% IV SOLN
1000.0000 mg | Freq: Two times a day (BID) | INTRAVENOUS | Status: AC
  Administered 2015-09-01: 1000 mg via INTRAVENOUS
  Filled 2015-09-01: qty 200

## 2015-09-01 MED ORDER — SODIUM CHLORIDE 0.9 % IR SOLN
Status: DC | PRN
  Administered 2015-09-01: 1000 mL

## 2015-09-01 MED ORDER — DEXAMETHASONE SODIUM PHOSPHATE 10 MG/ML IJ SOLN
10.0000 mg | Freq: Once | INTRAMUSCULAR | Status: AC
  Administered 2015-09-01: 10 mg via INTRAVENOUS

## 2015-09-01 MED ORDER — CHLORHEXIDINE GLUCONATE 4 % EX LIQD: 60.0000 mL | Freq: Once | CUTANEOUS | Status: DC

## 2015-09-01 MED ORDER — CELECOXIB 200 MG PO CAPS
200.0000 mg | ORAL_CAPSULE | Freq: Two times a day (BID) | ORAL | Status: DC
Start: 2015-09-01 — End: 2015-09-02
  Administered 2015-09-01 – 2015-09-02 (×2): 200 mg via ORAL
  Filled 2015-09-01 (×4): qty 1

## 2015-09-01 MED ORDER — BISACODYL 10 MG RE SUPP: 10.0000 mg | Freq: Every day | RECTAL | Status: DC | PRN

## 2015-09-01 MED ORDER — CEFAZOLIN SODIUM-DEXTROSE 2-3 GM-% IV SOLR
2.0000 g | INTRAVENOUS | Status: AC
  Administered 2015-09-01: 2 g via INTRAVENOUS

## 2015-09-01 MED ORDER — HYDROMORPHONE HCL 1 MG/ML IJ SOLN
0.5000 mg | INTRAMUSCULAR | Status: DC | PRN
  Administered 2015-09-01: 1 mg via INTRAVENOUS
  Administered 2015-09-01 – 2015-09-02 (×2): 0.5 mg via INTRAVENOUS
  Filled 2015-09-01 (×3): qty 1

## 2015-09-01 MED ORDER — EPHEDRINE SULFATE 50 MG/ML IJ SOLN
INTRAMUSCULAR | Status: DC | PRN
  Administered 2015-09-01 (×3): 5 mg via INTRAVENOUS

## 2015-09-01 MED ORDER — HYDROMORPHONE HCL 1 MG/ML IJ SOLN
INTRAMUSCULAR | Status: AC
  Filled 2015-09-01: qty 1

## 2015-09-01 MED ORDER — DOCUSATE SODIUM 100 MG PO CAPS
100.0000 mg | ORAL_CAPSULE | Freq: Two times a day (BID) | ORAL | Status: DC
  Administered 2015-09-01 – 2015-09-02 (×2): 100 mg via ORAL

## 2015-09-01 MED ORDER — PROMETHAZINE HCL 25 MG/ML IJ SOLN: 6.2500 mg | INTRAMUSCULAR | Status: DC | PRN

## 2015-09-01 MED ORDER — METOCLOPRAMIDE HCL 10 MG PO TABS: 5.0000 mg | ORAL_TABLET | Freq: Three times a day (TID) | ORAL | Status: DC | PRN

## 2015-09-01 MED ORDER — PHENYLEPHRINE 40 MCG/ML (10ML) SYRINGE FOR IV PUSH (FOR BLOOD PRESSURE SUPPORT)
PREFILLED_SYRINGE | INTRAVENOUS | Status: AC
  Filled 2015-09-01: qty 10

## 2015-09-01 MED ORDER — LIDOCAINE HCL (CARDIAC) 20 MG/ML IV SOLN
INTRAVENOUS | Status: DC | PRN
  Administered 2015-09-01: 100 mg via INTRAVENOUS

## 2015-09-01 MED ORDER — LACTATED RINGERS IV SOLN: INTRAVENOUS | Status: DC

## 2015-09-01 MED ORDER — PROPOFOL 10 MG/ML IV BOLUS
INTRAVENOUS | Status: AC
  Filled 2015-09-01: qty 40

## 2015-09-01 MED ORDER — LEVOTHYROXINE SODIUM 100 MCG PO TABS
100.0000 ug | ORAL_TABLET | Freq: Every day | ORAL | Status: DC
  Administered 2015-09-02: 100 ug via ORAL
  Filled 2015-09-01 (×2): qty 1

## 2015-09-01 MED ORDER — MIDAZOLAM HCL 5 MG/5ML IJ SOLN
INTRAMUSCULAR | Status: DC | PRN
  Administered 2015-09-01: 2 mg via INTRAVENOUS

## 2015-09-01 MED ORDER — FERROUS SULFATE 325 (65 FE) MG PO TABS
325.0000 mg | ORAL_TABLET | Freq: Three times a day (TID) | ORAL | Status: DC
  Administered 2015-09-02: 325 mg via ORAL
  Filled 2015-09-01 (×4): qty 1

## 2015-09-01 MED ORDER — LIDOCAINE HCL (CARDIAC) 20 MG/ML IV SOLN
INTRAVENOUS | Status: AC
  Filled 2015-09-01: qty 5

## 2015-09-01 MED ORDER — MIDAZOLAM HCL 2 MG/2ML IJ SOLN
INTRAMUSCULAR | Status: AC
  Filled 2015-09-01: qty 2

## 2015-09-01 MED ORDER — SODIUM CHLORIDE 0.9 % IV SOLN
100.0000 mL/h | INTRAVENOUS | Status: DC
  Administered 2015-09-01 – 2015-09-02 (×2): 100 mL/h via INTRAVENOUS
  Filled 2015-09-01 (×4): qty 1000

## 2015-09-01 MED ORDER — MENTHOL 3 MG MT LOZG: 1.0000 | LOZENGE | OROMUCOSAL | Status: DC | PRN

## 2015-09-01 MED ORDER — LACTATED RINGERS IV SOLN
INTRAVENOUS | Status: DC | PRN
  Administered 2015-09-01: 11:00:00 via INTRAVENOUS

## 2015-09-01 MED ORDER — DEXAMETHASONE SODIUM PHOSPHATE 10 MG/ML IJ SOLN
INTRAMUSCULAR | Status: AC
  Filled 2015-09-01: qty 1

## 2015-09-01 MED ORDER — POLYETHYLENE GLYCOL 3350 17 G PO PACK
17.0000 g | PACK | Freq: Two times a day (BID) | ORAL | Status: DC
  Administered 2015-09-01 – 2015-09-02 (×2): 17 g via ORAL

## 2015-09-01 MED ORDER — HYDROMORPHONE HCL 1 MG/ML IJ SOLN
1.0000 mg | INTRAMUSCULAR | Status: DC | PRN
  Administered 2015-09-01 (×2): 0.5 mg via INTRAVENOUS

## 2015-09-01 MED ORDER — ASPIRIN EC 325 MG PO TBEC
325.0000 mg | DELAYED_RELEASE_TABLET | Freq: Two times a day (BID) | ORAL | Status: DC
  Administered 2015-09-02: 325 mg via ORAL
  Filled 2015-09-01 (×3): qty 1

## 2015-09-01 MED ORDER — ONDANSETRON HCL 4 MG/2ML IJ SOLN
4.0000 mg | Freq: Four times a day (QID) | INTRAMUSCULAR | Status: DC | PRN
  Administered 2015-09-01: 4 mg via INTRAVENOUS
  Filled 2015-09-01: qty 2

## 2015-09-01 MED ORDER — ONDANSETRON HCL 4 MG/2ML IJ SOLN
INTRAMUSCULAR | Status: DC | PRN
  Administered 2015-09-01: 4 mg via INTRAVENOUS

## 2015-09-01 MED ORDER — MEPERIDINE HCL 50 MG/ML IJ SOLN: 6.2500 mg | INTRAMUSCULAR | Status: DC | PRN

## 2015-09-01 SURGICAL SUPPLY — 34 items
BAG DECANTER FOR FLEXI CONT (MISCELLANEOUS) IMPLANT
BAG ZIPLOCK 12X15 (MISCELLANEOUS) IMPLANT
CAPT HIP TOTAL 2 ×2 IMPLANT
CLOTH BEACON ORANGE TIMEOUT ST (SAFETY) ×2 IMPLANT
COVER PERINEAL POST (MISCELLANEOUS) ×2 IMPLANT
DRAPE STERI IOBAN 125X83 (DRAPES) ×2 IMPLANT
DRAPE U-SHAPE 47X51 STRL (DRAPES) ×4 IMPLANT
DRSG AQUACEL AG ADV 3.5X10 (GAUZE/BANDAGES/DRESSINGS) ×2 IMPLANT
DURAPREP 26ML APPLICATOR (WOUND CARE) ×2 IMPLANT
ELECT REM PT RETURN 15FT ADLT (MISCELLANEOUS) IMPLANT
ELECT REM PT RETURN 9FT ADLT (ELECTROSURGICAL) ×2
ELECTRODE REM PT RTRN 9FT ADLT (ELECTROSURGICAL) ×1 IMPLANT
GLOVE BIOGEL M 7.0 STRL (GLOVE) IMPLANT
GLOVE BIOGEL PI IND STRL 7.5 (GLOVE) ×1 IMPLANT
GLOVE BIOGEL PI IND STRL 8.5 (GLOVE) ×1 IMPLANT
GLOVE BIOGEL PI INDICATOR 7.5 (GLOVE) ×1
GLOVE BIOGEL PI INDICATOR 8.5 (GLOVE) ×1
GLOVE ECLIPSE 8.0 STRL XLNG CF (GLOVE) ×4 IMPLANT
GLOVE ORTHO TXT STRL SZ7.5 (GLOVE) ×2 IMPLANT
GOWN STRL REUS W/TWL LRG LVL3 (GOWN DISPOSABLE) ×2 IMPLANT
GOWN STRL REUS W/TWL XL LVL3 (GOWN DISPOSABLE) ×2 IMPLANT
HOLDER FOLEY CATH W/STRAP (MISCELLANEOUS) ×2 IMPLANT
LIQUID BAND (GAUZE/BANDAGES/DRESSINGS) ×2 IMPLANT
PACK ANTERIOR HIP CUSTOM (KITS) ×2 IMPLANT
SAW OSC TIP CART 19.5X105X1.3 (SAW) ×2 IMPLANT
SUT MNCRL AB 4-0 PS2 18 (SUTURE) ×2 IMPLANT
SUT VIC AB 1 CT1 36 (SUTURE) ×6 IMPLANT
SUT VIC AB 2-0 CT1 27 (SUTURE) ×2
SUT VIC AB 2-0 CT1 TAPERPNT 27 (SUTURE) ×2 IMPLANT
SUT VLOC 180 0 24IN GS25 (SUTURE) ×2 IMPLANT
TRAY FOLEY W/METER SILVER 14FR (SET/KITS/TRAYS/PACK) ×2 IMPLANT
TRAY FOLEY W/METER SILVER 16FR (SET/KITS/TRAYS/PACK) IMPLANT
WATER STERILE IRR 1500ML POUR (IV SOLUTION) ×2 IMPLANT
YANKAUER SUCT BULB TIP 10FT TU (MISCELLANEOUS) IMPLANT

## 2015-09-01 NOTE — Op Note (Signed)
NAME:  Madison Alvarez                ACCOUNT NO.: [id number]      MEDICAL RECORD NO.: [id number]      FACILITY:  [facility]      PHYSICIAN:  Paralee Cancel D  DATE OF BIRTH:  05/03/1946     DATE OF PROCEDURE:  09/01/2015                                 OPERATIVE REPORT         PREOPERATIVE DIAGNOSIS: Right  hip osteoarthritis.      POSTOPERATIVE DIAGNOSIS:  Right hip osteoarthritis.      PROCEDURE:  Right total hip replacement through an anterior approach   utilizing DePuy THR system, component size 35mm pinnacle cup, a size 36+4 neutral   Altrex liner, a size 3 Hi Tri Lock stem with a 36+5 delta ceramic   ball.      SURGEON:  Pietro Cassis. Alvan Dame, M.D.      ASSISTANT:  Nehemiah Massed, PA-C     ANESTHESIA:  Spinal.      SPECIMENS:  None.      COMPLICATIONS:  None.      BLOOD LOSS:  550 cc     DRAINS:  none      INDICATION OF THE PROCEDURE:  Madison Alvarez is a 70 y.o. female who had   presented to office for evaluation of right hip pain.  Radiographs revealed   progressive degenerative changes with bone-on-bone   articulation to the  hip joint.  The patient had painful limited range of   motion significantly affecting their overall quality of life.  The patient was failing to    respond to conservative measures, and at this point was ready   to proceed with more definitive measures.  The patient has noted progressive   degenerative changes in his hip, progressive problems and dysfunction   with regarding the hip prior to surgery.  Consent was obtained for   benefit of pain relief.  Specific risk of infection, DVT, component   failure, dislocation, need for revision surgery, as well discussion of   the anterior versus posterior approach were reviewed.  Consent was   obtained for benefit of anterior pain relief through an anterior   approach.      PROCEDURE IN DETAIL:  The patient was brought to operative theater.   Once adequate anesthesia, preoperative  antibiotics, 2gm of Ancef, 1 gm of Tranexamic Acid, and 10 mg of Decadron administered.   The patient was positioned supine on the OSI [place]anna table.  Once adequate   padding of boney process was carried out, we had predraped out the hip, and  used fluoroscopy to confirm orientation of the pelvis and position.      The right hip was then prepped and draped from proximal iliac crest to   mid thigh with shower curtain technique.      Time-out was performed identifying the patient, planned procedure, and   extremity.     An incision was then made 2 cm distal and lateral to the   anterior superior iliac spine extending over the orientation of the   tensor fascia lata muscle and sharp dissection was carried down to the   fascia of the muscle and protractor placed in the soft tissues.      The fascia was  then incised.  The muscle belly was identified and swept   laterally and retractor placed along the superior neck.  Following   cauterization of the circumflex vessels and removing some pericapsular   fat, a second cobra retractor was placed on the inferior neck.  A third   retractor was placed on the anterior acetabulum after elevating the   anterior rectus.  A L-capsulotomy was along the line of the   superior neck to the trochanteric fossa, then extended proximally and   distally.  Tag sutures were placed and the retractors were then placed   intracapsular.  We then identified the trochanteric fossa and   orientation of my neck cut, confirmed this radiographically   and then made a neck osteotomy with the femur on traction.  The femoral   head was removed without difficulty or complication.  Traction was let   off and retractors were placed posterior and anterior around the   acetabulum.      The labrum and foveal tissue were debrided.  I began reaming with a 57mm   reamer and reamed up to 28mm reamer with good bony bed preparation and a 61mm   cup was chosen.  The final 78mm Pinnacle cup  was then impacted under fluoroscopy  to confirm the depth of penetration and orientation with respect to   abduction.  A screw was placed followed by the hole eliminator.  The final   36+4 neutral Altrex liner was impacted with good visualized rim fit.  The cup was positioned anatomically within the acetabular portion of the pelvis.      At this point, the femur was rolled at 80 degrees.  Further capsule was   released off the inferior aspect of the femoral neck.  I then   released the superior capsule proximally.  The hook was placed laterally   along the femur and elevated manually and held in position with the bed   hook.  The leg was then extended and adducted with the leg rolled to 100   degrees of external rotation.  Once the proximal femur was fully   exposed, I used a box osteotome to set orientation.  I then began   broaching with the starting chili pepper broach and passed this by hand and then broached up to 3.  With the 3 broach in place I chose a high offset neck and did several trial reductions.  The offset was appropriate, leg lengths   appeared to be equal best matched with +5 head ball, confirmed radiographically.   Given these findings, I went ahead and dislocated the hip, repositioned all   retractors and positioned the right hip in the extended and abducted position.  The final 3 Hi Tri Lock stem was   chosen and it was impacted down to the level of neck cut.  Based on this   and the trial reduction, a 36+5 delta ceramic ball was chosen and   impacted onto a clean and dry trunnion, and the hip was reduced.  The   hip had been irrigated throughout the case again at this point.  I did   reapproximate the superior capsular leaflet to the anterior leaflet   using #1 Vicryl.  The fascia of the   tensor fascia lata muscle was then reapproximated using #1 Vicryl.  The   remaining wound was closed with 2-0 Vicryl and running 4-0 Monocryl.   The hip was cleaned, dried, and dressed  sterilely using Dermabond and  Aquacel dressing.  She was then brought   to recovery room in stable condition tolerating the procedure well.    Nehemiah Massed, PA-C was present for the entirety of the case involved from   preoperative positioning, perioperative retractor management, general   facilitation of the case, as well as primary wound closure as assistant.            Pietro Cassis Alvan Dame, M.D.        09/01/2015 2:06 PM

## 2015-09-01 NOTE — Anesthesia Procedure Notes (Addendum)
Spinal Patient location during procedure: OR Start time: 09/01/2015 12:35 PM End time: 09/01/2015 12:37 PM Staffing Anesthesiologist: Suella Broad D Preanesthetic Checklist Completed: patient identified, site marked, surgical consent, pre-op evaluation, timeout performed, IV checked, risks and benefits discussed and monitors and equipment checked Spinal Block Patient position: sitting Prep: Betadine Patient monitoring: heart rate, continuous pulse ox, blood pressure and cardiac monitor Approach: midline Location: L4-5 Injection technique: single-shot Needle Needle type: Whitacre and Introducer  Needle gauge: 24 G Needle length: 9 cm Additional Notes Negative paresthesia. Negative blood return. Positive free-flowing CSF. Expiration date of kit checked and confirmed. Patient tolerated procedure well, without complications.    Procedure Name: MAC Date/Time: 09/01/2015 12:23 PM Performed by: Carleene Cooper A Pre-anesthesia Checklist: Patient identified, Timeout performed, Emergency Drugs available, Suction available and Patient being monitored Patient Re-evaluated:Patient Re-evaluated prior to inductionOxygen Delivery Method: Simple face mask Dental Injury: Teeth and Oropharynx as per pre-operative assessment

## 2015-09-01 NOTE — Progress Notes (Signed)
Utilization review completed.  

## 2015-09-01 NOTE — Interval H&P Note (Signed)
History and Physical Interval Note:  09/01/2015 11:19 AM  Madison Alvarez  has presented today for surgery, with the diagnosis of RIGHT HIP OA  The various methods of treatment have been discussed with the patient and family. After consideration of risks, benefits and other options for treatment, the patient has consented to  Procedure(s): RIGHT TOTAL HIP ARTHROPLASTY ANTERIOR APPROACH (Right) as a surgical intervention .  The patient's history has been reviewed, patient examined, no change in status, stable for surgery.  I have reviewed the patient's chart and labs.  Questions were answered to the patient's satisfaction.     [name]

## 2015-09-01 NOTE — Anesthesia Postprocedure Evaluation (Signed)
Anesthesia Post Note  Patient: Madison Alvarez  Procedure(s) Performed: Procedure(s) (LRB): RIGHT TOTAL HIP ARTHROPLASTY ANTERIOR APPROACH (Right)  Patient location during evaluation: PACU Anesthesia Type: Spinal Level of consciousness: oriented and awake and alert Pain management: pain level controlled Vital Signs Assessment: post-procedure vital signs reviewed and stable Respiratory status: spontaneous breathing, respiratory function stable and patient connected to nasal cannula oxygen Cardiovascular status: blood pressure returned to baseline and stable Postop Assessment: no headache, no backache and spinal receding Anesthetic complications: no    Last Vitals:  Filed Vitals:   09/01/15 1530 09/01/15 1549  BP: 92/56 99/51  Pulse: 51 59  Temp: 36.4 C 36.4 C  Resp: 18 16    Last Pain:  Filed Vitals:   09/01/15 1618  PainSc: [name]

## 2015-09-01 NOTE — Discharge Instructions (Signed)

## 2015-09-01 NOTE — Transfer of Care (Signed)
Immediate Anesthesia Transfer of Care Note  Patient: Madison Alvarez  Procedure(s) Performed: Procedure(s): RIGHT TOTAL HIP ARTHROPLASTY ANTERIOR APPROACH (Right)  Patient Location: PACU  Anesthesia Type:Spinal  Level of Consciousness: awake, alert , oriented and patient cooperative  Airway & Oxygen Therapy: Patient Spontanous Breathing and Patient connected to face mask oxygen  Post-op Assessment: Report given to RN and Post -op Vital signs reviewed and stable  Post vital signs: Reviewed and stable  Last Vitals:  Filed Vitals:   09/01/15 1002  BP: 144/68  Pulse: 59  Temp: 36.3 C  Resp: 16    Complications: No apparent anesthesia complications

## 2015-09-01 NOTE — Anesthesia Preprocedure Evaluation (Addendum)
Anesthesia Evaluation  Patient identified by MRN, date of birth, ID band Patient awake    Reviewed: Allergy & Precautions, NPO status , Patient's Chart, lab work & pertinent test results  Airway Mallampati: II  TM Distance: >3 FB Neck ROM: Full    Dental  (+) Teeth Intact   Pulmonary neg pulmonary ROS,    breath sounds clear to auscultation       Cardiovascular negative cardio ROS   Rhythm:Regular Rate:Normal     Neuro/Psych negative neurological ROS  negative psych ROS   GI/Hepatic negative GI ROS, Neg liver ROS,   Endo/Other  Hypothyroidism   Renal/GU negative Renal ROS  negative genitourinary   Musculoskeletal  (+) Arthritis , Osteoarthritis,    Abdominal   Peds negative pediatric ROS (+)  Hematology   Anesthesia Other Findings   Reproductive/Obstetrics negative OB ROS                           Anesthesia Physical Anesthesia Plan  ASA: II  Anesthesia Plan: Spinal   Post-op Pain Management:    Induction: Intravenous  Airway Management Planned: Natural Airway and Simple Face Mask  Additional Equipment:   Intra-op Plan:   Post-operative Plan:   Informed Consent: I have reviewed the patients History and Physical, chart, labs and discussed the procedure including the risks, benefits and alternatives for the proposed anesthesia with the patient or authorized representative who has indicated his/her understanding and acceptance.     Plan Discussed with:   Anesthesia Plan Comments:         Anesthesia Quick Evaluation

## 2015-09-02 LAB — BASIC METABOLIC PANEL
Anion gap: 6 (ref 5–15)
BUN: 20 mg/dL (ref 6–20)
CALCIUM: 8.5 mg/dL — AB (ref 8.9–10.3)
CO2: 25 mmol/L (ref 22–32)
CREATININE: 0.67 mg/dL (ref 0.44–1.00)
Chloride: 103 mmol/L (ref 101–111)
GFR calc Af Amer: >60 mL/min (ref >60)
GLUCOSE: 161 mg/dL — AB (ref 65–99)
Potassium: 4.8 mmol/L (ref 3.5–5.1)
SODIUM: 134 mmol/L — AB (ref 135–145)

## 2015-09-02 LAB — CBC
HCT: 27.3 % — ABNORMAL LOW (ref 36.0–46.0)
Hemoglobin: 9.5 g/dL — ABNORMAL LOW (ref 12.0–15.0)
MCH: 30.3 pg (ref 26.0–34.0)
MCHC: 34.8 g/dL (ref 30.0–36.0)
MCV: 86.9 fL (ref 78.0–100.0)
PLATELETS: 218 10*3/uL (ref 150–400)
RBC: 3.14 MIL/uL — AB (ref 3.87–5.11)
RDW: 12.7 % (ref 11.5–15.5)
WBC: 10.7 10*3/uL — ABNORMAL HIGH (ref 4.0–10.5)

## 2015-09-02 MED ORDER — SODIUM CHLORIDE 0.9 % IV BOLUS (SEPSIS)
250.0000 mL | Freq: Once | INTRAVENOUS | Status: AC
  Administered 2015-09-02: 250 mL via INTRAVENOUS

## 2015-09-02 MED ORDER — TIZANIDINE HCL 4 MG PO TABS: 4.0000 mg | ORAL_TABLET | Freq: Four times a day (QID) | ORAL | Status: DC | PRN

## 2015-09-02 MED ORDER — FERROUS SULFATE 325 (65 FE) MG PO TABS: 325.0000 mg | ORAL_TABLET | Freq: Three times a day (TID) | ORAL | Status: DC

## 2015-09-02 MED ORDER — DOCUSATE SODIUM 100 MG PO CAPS: 100.0000 mg | ORAL_CAPSULE | Freq: Two times a day (BID) | ORAL | Status: DC

## 2015-09-02 MED ORDER — HYDROCODONE-ACETAMINOPHEN 7.5-325 MG PO TABS: 1.0000 | ORAL_TABLET | ORAL | Status: DC | PRN

## 2015-09-02 MED ORDER — POLYETHYLENE GLYCOL 3350 17 G PO PACK: 17.0000 g | PACK | Freq: Two times a day (BID) | ORAL | Status: DC

## 2015-09-02 MED ORDER — ASPIRIN 325 MG PO TBEC: 325.0000 mg | DELAYED_RELEASE_TABLET | Freq: Two times a day (BID) | ORAL | Status: AC

## 2015-09-02 NOTE — Progress Notes (Signed)
     Subjective: 1 Day Post-Op Procedure(s) (LRB): RIGHT TOTAL HIP ARTHROPLASTY ANTERIOR APPROACH (Right)   Patient reports pain as mild, pain controlled. No events throughout the night. Already walking well with PT.   Ready to be discharged home.  Objective:   VITALS:   Filed Vitals:   09/02/15 0143 09/02/15 0408  BP: 102/54 96/54  Pulse: 62 61  Temp: 98 F (36.7 C) 98 F (36.7 C)  Resp: 14 16    Dorsiflexion/Plantar flexion intact Incision: dressing C/D/I No cellulitis present Compartment soft  LABS  Recent Labs  09/02/15 0403  HGB 9.5*  HCT 27.3*  WBC 10.7*  PLT 218     Recent Labs  09/02/15 0403  NA 134*  K 4.8  BUN 20  CREATININE 0.67  GLUCOSE 161*     Assessment/Plan: 1 Day Post-Op Procedure(s) (LRB): RIGHT TOTAL HIP ARTHROPLASTY ANTERIOR APPROACH (Right) Foley cath d/c'ed Advance diet Up with therapy D/C IV fluids Discharge home with home health  Follow up in 2 weeks at [facility]. Follow up with OLIN,Edell Mesenbrink D in 2 weeks.  Contact information:  [facility] [address], [address] [place] Alexandra Lipps   PAC  09/02/2015, 8:59 AM

## 2015-09-02 NOTE — Evaluation (Signed)
Physical Therapy Evaluation Patient Details Name: Madison Alvarez MRN: [id number] DOB: 18-Jan-1946 Today's Date: 09/02/2015   History of Present Illness  RDATHA  Clinical Impression  The patient  Tolerated  Ambulation avery well. Reports R hip feels better OOB. Will benefit from PT while in acute care to DC to home, possibly today.    Follow Up Recommendations No PT follow up    Equipment Recommendations  None recommended by PT    Recommendations for Other Services       Precautions / Restrictions Precautions Precautions: Fall Restrictions Weight Bearing Restrictions: No      Mobility  Bed Mobility Overal bed mobility: Needs Assistance Bed Mobility: Supine to Sit     Supine to sit: Min guard     General bed mobility comments: used sheet to saelf assist the leg to the edge of the bed.  Transfers Overall transfer level: Needs assistance Equipment used: Standard walker Transfers: Sit to/from Stand Sit to Stand: Supervision         General transfer comment: cues for hand and Right leg position  Ambulation/Gait Ambulation/Gait assistance: Min guard Ambulation Distance (Feet): 200 Feet Assistive device: Standard walker Gait Pattern/deviations: Step-to pattern;Step-through pattern     General Gait Details: cues for sequence  Stairs            Wheelchair Mobility    Modified Rankin (Stroke Patients Only)       Balance                                             Pertinent Vitals/Pain Pain Assessment: 0-10 Pain Score: 4  Pain Location: R thigh Pain Descriptors / Indicators: Tightness Pain Intervention(s): Monitored during session;Premedicated before session;Repositioned;Ice applied    Home Living Family/patient expects to be discharged to:: Private residence Living Arrangements: Spouse/significant other Available Help at Discharge: Family Type of Home: House Home Access: Stairs to enter Entrance Stairs-Rails: Left Entrance  Stairs-Number of Steps: 10 Home Layout: Two level;Bed/bath upstairs Home Equipment: Environmental consultant - standard;Crutches      Prior Function Level of Independence: Independent               Hand Dominance        Extremity/Trunk Assessment   Upper Extremity Assessment: Defer to OT evaluation           Lower Extremity Assessment: RLE deficits/detail RLE Deficits / Details: able to extend knee without assist    Cervical / Trunk Assessment: Normal  Communication   Communication: No difficulties  Cognition Arousal/Alertness: Awake/alert Behavior During Therapy: WFL for tasks assessed/performed Overall Cognitive Status: Within Functional Limits for tasks assessed                      General Comments      Exercises Total Joint Exercises Ankle Circles/Pumps: AROM;Both;10 reps;Supine Short Arc Quad: AROM;10 reps Heel Slides: AAROM;Right;10 reps;Supine Hip ABduction/ADduction: AAROM;Right;Supine      Assessment/Plan    PT Assessment Patient needs continued PT services  PT Diagnosis Difficulty walking;Acute pain   PT Problem List Decreased strength;Decreased range of motion;Decreased activity tolerance;Decreased mobility;Decreased knowledge of precautions;Decreased safety awareness;Decreased knowledge of use of DME;Pain  PT Treatment Interventions DME instruction;Gait training;Stair training;Functional mobility training;Therapeutic activities;Therapeutic exercise;Patient/family education   PT Goals (Current goals can be found in the Care Plan section) Acute Rehab PT Goals Patient Stated Goal: to go  home, work in my yard PT Goal Formulation: With patient Time For Goal Achievement: 09/04/15 Potential to Achieve Goals: Good    Frequency 7X/week   Barriers to discharge        Co-evaluation               End of Session   Activity Tolerance: Patient tolerated treatment well Patient left: in chair;with call bell/phone within reach;with chair alarm  set Nurse Communication: Mobility status         Time: 0825-0911 PT Time Calculation (min) (ACUTE ONLY): 46 min   Charges:   PT Evaluation $PT Eval Low Complexity: 1 Procedure PT Treatments $Gait Training: 8-22 mins $Therapeutic Exercise: 8-22 mins   PT G Codes:        Claretha Cooper 09/02/2015, 9:20 AM Tresa Endo PT [phone]

## 2015-09-02 NOTE — Care Management Note (Signed)
Case Management Note  Patient Details  Name: LILIANN FILE MRN: [id number] Date of Birth: 1946/05/13  Subjective/Objective:                  RIGHT TOTAL HIP ARTHROPLASTY ANTERIOR APPROACH (Right) Action/Plan: Discharge planning Expected Discharge Date:  09/02/15               Expected Discharge Plan:  Home/Self Care  In-House Referral:     Discharge planning Services  CM Consult  Post Acute Care Choice:    Choice offered to:  Patient  DME Arranged:  N/A DME Agency:  NA  HH Arranged:  NA HH Agency:  NA  Status of Service:  Completed, signed off  Medicare Important Message Given:    Date Medicare IM Given:    Medicare IM give by:    Date Additional Medicare IM Given:    Additional Medicare Important Message give by:     If discussed at [place] of Stay Meetings, dates discussed:    Additional Comments: CM met with pt to confirm NO Home health services.  Pt states she's comfortable going home and has both a rolling walker and 3n1 at home.  No other CM needs were communicated. Dellie Catholic, RN 09/02/2015, 10:51 AM

## 2015-09-02 NOTE — Progress Notes (Signed)
Occupational Therapy Treatment Patient Details Name: Madison Alvarez MRN: [id number] DOB: 13-Jul-1945 Today's Date: 09/02/2015    History of present illness RDATHA   OT comments  All OT education completed. No further OT needs at this time. Will sign off.  Follow Up Recommendations  No OT follow up;Supervision - Intermittent    Equipment Recommendations  None recommended by OT    Recommendations for Other Services      Precautions / Restrictions Precautions Precautions: Fall Restrictions Other Position/Activity Restrictions: WBAT       Mobility Bed Mobility Overal bed mobility: Modified Independent Bed Mobility: Supine to Sit            Transfers Overall transfer level: Needs assistance Equipment used: Rolling walker (2 wheeled) Transfers: Sit to/from Stand Sit to Stand: Supervision             Balance                                   ADL Overall ADL's : Needs assistance/impaired Eating/Feeding: Independent;Bed level   Grooming: Wash/dry hands;Supervision/safety;Standing           Upper Body Dressing : Set up;Sitting   Lower Body Dressing: Minimal assistance;Sit to/from stand   Toilet Transfer: Supervision/safety;Ambulation;Comfort height toilet;RW   Toileting- Clothing Manipulation and Hygiene: Supervision/safety;Sit to/from stand   Tub/ Shower Transfer: Tub transfer;Minimal assistance;Rolling walker   Functional mobility during ADLs: Supervision/safety;Rolling walker General ADL Comments: Patient practiced ambulating to bathroom with RW, toilet transfer/toileting, donning clothes, grooming at sink, and shower transfer (simulated tub transfer). Tolerated well. Husband present to observe techniques. Patient left in the care of PT to practice stairs.       Vision                     Perception     Praxis      Cognition   Behavior During Therapy: WFL for tasks assessed/performed Overall Cognitive Status: Within  Functional Limits for tasks assessed                       Extremity/Trunk Assessment  Upper Extremity Assessment Upper Extremity Assessment: Overall WFL for tasks assessed   Lower Extremity Assessment Lower Extremity Assessment: Defer to PT evaluation    Cervical / Trunk Assessment Cervical / Trunk Assessment: Normal    Exercises    Shoulder Instructions       General Comments      Pertinent Vitals/ Pain       Pain Assessment: 0-10 Pain Score: 3  Pain Location: R hip/thigh Pain Descriptors / Indicators: Tightness;Sore Pain Intervention(s): Monitored during session;Premedicated before session;Repositioned  Home Living Family/patient expects to be discharged to:: Private residence Living Arrangements: Spouse/significant other Available Help at Discharge: Family Type of Home: House Home Access: Stairs to enter Technical brewer of Steps: 10 Entrance Stairs-Rails: Left Home Layout: Two level;Bed/bath upstairs Alternate Level Stairs-Number of Steps: 2+12 Alternate Level Stairs-Rails: Left Bathroom Shower/Tub: Tub/shower unit;Curtain Shower/tub characteristics: Architectural technologist: Handicapped height Bathroom Accessibility: Yes How Accessible: Accessible via walker Home Equipment: Gilford Rile - standard;Crutches          Prior Functioning/Environment Level of Independence: Independent            Frequency Min 2X/week     Progress Toward Goals  OT Goals(current goals can now be found in the care plan section)  Progress towards OT goals: Goals  met/education completed, patient discharged from OT  Acute Rehab OT Goals Patient Stated Goal: to go home today OT Goal Formulation: With patient Time For Goal Achievement: 09/09/15 Potential to Achieve Goals: Good  Plan All goals met and education completed, patient discharged from OT services    Co-evaluation                 End of Session Equipment Utilized During Treatment: Rolling  walker   Activity Tolerance Patient tolerated treatment well   Patient Left Other (comment) (in w/c with PT to practice stairs)   Nurse Communication Mobility status        Time: 1215-1226 OT Time Calculation (min): 11 min  Charges: OT General Charges $OT Visit: 1 Procedure OT Treatments $Self Care/Home Management : 8-22 mins  Early, Leila A 09/02/2015, 12:48 PM

## 2015-09-02 NOTE — Progress Notes (Signed)
Physical Therapy Treatment Patient Details Name: Madison Alvarez MRN: [id number] DOB: Mar 23, 1946 Today's Date: 09/02/2015    History of Present Illness RDATHA    PT Comments    Home instruction complete. Ready for DC.  Follow Up Recommendations  No PT follow up     Equipment Recommendations  None recommended by PT    Recommendations for Other Services       Precautions / Restrictions Precautions Precautions: Fall Restrictions Other Position/Activity Restrictions: WBAT    Mobility  Bed Mobility Overal bed mobility: Modified Independent Bed Mobility: Supine to Sit;Sit to Supine     Supine to sit: Supervision Sit to supine: Min assist   General bed mobility comments: used sheet to saelf assist the leg to the edge of the bed.  Transfers Overall transfer level: Needs assistance Equipment used: Rolling walker (2 wheeled) Transfers: Sit to/from Stand Sit to Stand: Supervision         General transfer comment: cues for hand and Right leg position  Ambulation/Gait Ambulation/Gait assistance: Min guard Ambulation Distance (Feet): 50 Feet Assistive device: Rolling walker (2 wheeled) Gait Pattern/deviations: Step-to pattern;Step-through pattern     General Gait Details: cues for sequence   Stairs Stairs: Yes Stairs assistance: Min assist Stair Management: One rail Left;Step to pattern;Forwards;With crutches Number of Stairs: 4    Wheelchair Mobility    Modified Rankin (Stroke Patients Only)       Balance                                    Cognition Arousal/Alertness: Awake/alert Behavior During Therapy: WFL for tasks assessed/performed Overall Cognitive Status: Within Functional Limits for tasks assessed                      Exercises Total Joint Exercises Ankle Circles/Pumps: AROM;Both;10 reps;Supine Quad Sets: AROM Short Arc Quad: AROM;10 reps Heel Slides: AAROM;Right;10 reps;Supine Hip ABduction/ADduction:  AAROM;Right;Supine Long Arc Quad: AROM;Right;10 reps;Seated Marching in Standing: AAROM;Right;Seated    General Comments        Pertinent Vitals/Pain Pain Assessment: 0-10 Pain Score: 3  Pain Location: R thigh Pain Descriptors / Indicators: Tightness;Tender Pain Intervention(s): Monitored during session    Home Living Family/patient expects to be discharged to:: Private residence Living Arrangements: Spouse/significant other Available Help at Discharge: Family Type of Home: House Home Access: Stairs to enter Entrance Stairs-Rails: Left Home Layout: Two level;Bed/bath upstairs Home Equipment: Environmental consultant - standard;Crutches      Prior Function Level of Independence: Independent          PT Goals (current goals can now be found in the care plan section) Acute Rehab PT Goals Patient Stated Goal: to go home today PT Goal Formulation: With patient Time For Goal Achievement: 09/04/15 Potential to Achieve Goals: Good Progress towards PT goals: Progressing toward goals    Frequency  7X/week    PT Plan Current plan remains appropriate    Co-evaluation             End of Session   Activity Tolerance: Patient tolerated treatment well Patient left: in chair;with family/visitor present;with nursing/sitter in room     Time: 1230-1256 PT Time Calculation (min) (ACUTE ONLY): 26 min  Charges:  $Gait Training: 8-22 mins $Therapeutic Exercise: 8-22 mins                    G Codes:  Claretha Cooper 09/02/2015, 1:11 PM

## 2015-09-02 NOTE — Progress Notes (Signed)
Occupational Therapy Evaluation Patient Details Name: Madison Alvarez MRN: [id number] DOB: [birth year] Today's Date: 09/02/2015    History of Present Illness Madison Alvarez   Clinical Impression   Limited evaluation due to patient just getting comfortable back in bed after walking to bathroom and toileting. Will return later to address tub transfer/getting dressed.     Follow Up Recommendations  No OT follow up;Supervision - Intermittent    Equipment Recommendations  None recommended by OT    Recommendations for Other Services       Precautions / Restrictions Precautions Precautions: Fall Restrictions Other Position/Activity Restrictions: WBAT      Mobility Bed Mobility              Transfers                 Balance                                            ADL Overall ADL's : Needs assistance/impaired                                       General ADL Comments: Patient reports she just got back in bed after toileting in bathroom and donning underwear. She did not wish to get OOB at this time. Demonstrated tub transfer technique to patient and husband and answered questions. Told pt I would return later to have her practice tub transfer/getting dressed. Patient and husband in agreement with this plan.     Vision     Perception     Praxis      Pertinent Vitals/Pain Pain Assessment: 0-10 Pain Score: 3  Pain Location: R hip/thigh Pain Descriptors / Indicators: Tightness;Sore Pain Intervention(s): Limited activity within patient's tolerance;Monitored during session     Hand Dominance Right   Extremity/Trunk Assessment Upper Extremity Assessment Upper Extremity Assessment: Overall WFL for tasks assessed   Lower Extremity Assessment Lower Extremity Assessment: Defer to PT evaluation       Communication Communication Communication: No difficulties   Cognition Arousal/Alertness: Awake/alert Behavior During  Therapy: WFL for tasks assessed/performed Overall Cognitive Status: Within Functional Limits for tasks assessed                     General Comments       Exercises       Shoulder Instructions      Home Living Family/patient expects to be discharged to:: Private residence Living Arrangements: Spouse/significant other Available Help at Discharge: Family Type of Home: House Home Access: Stairs to enter Technical brewer of Steps: 10 Entrance Stairs-Rails: Left Home Layout: Two level;Bed/bath upstairs Alternate Level Stairs-Number of Steps: 2+12 Alternate Level Stairs-Rails: Left Bathroom Shower/Tub: Tub/shower unit;Curtain Shower/tub characteristics: Architectural technologist: Handicapped height Bathroom Accessibility: Yes How Accessible: Accessible via walker Home Equipment: Gilford Rile - standard;Crutches          Prior Functioning/Environment Level of Independence: Independent             OT Diagnosis: Acute pain   OT Problem List: Pain;Decreased knowledge of use of DME or AE;Decreased strength;Decreased range of motion;Decreased activity tolerance   OT Treatment/Interventions: Self-care/ADL training;DME and/or AE instruction;Therapeutic activities;Patient/family education    OT Goals(Current goals can be found in the care plan section) Acute Rehab OT Goals Patient  Stated Goal: to go home today OT Goal Formulation: With patient Time For Goal Achievement: 09/09/15 Potential to Achieve Goals: Good  OT Frequency: Min 2X/week   Barriers to D/C:            Co-evaluation              End of Session    Activity Tolerance: Patient tolerated treatment well;Patient limited by fatigue Patient left: in bed;with call bell/phone within reach;with family/visitor present   Time: [id number] OT Time Calculation (min): 17 min Charges:  OT General Charges $OT Visit: 1 Procedure OT Evaluation $OT Eval Low Complexity: 1 Procedure G-Codes:    Madison Alvarez  A 09/21/2015, 12:43 PM

## 2015-09-02 NOTE — Progress Notes (Signed)
Pt complaining of bladder pain. Pt rating pain 10 on a scale of 1-10. Pt draining clear yellow urine. Bladder scan shows 50cc. Position of catheter checked and the pt states pain is worse when the catheter moves in the urethra. Medications given for pain and spasms, will recheck effectiveness of ordered medications.

## 2015-09-07 NOTE — Discharge Summary (Signed)
Physician Discharge Summary  Patient ID: Madison Alvarez MRN: [id number] DOB/AGE: [birth year] 70 y.o.  Admit date: 09/01/2015 Discharge date: 09/02/2015   Procedures:  Procedure(s) (LRB): RIGHT TOTAL HIP ARTHROPLASTY ANTERIOR APPROACH (Right)  Attending Physician:  Dr. Paralee Cancel   Admission Diagnoses:   Right hip primary OA / pain  Discharge Diagnoses:  Principal Problem:   S/P right THA, AA  Past Medical History  Diagnosis Date  . Cancer [facility])     Melanoma '02- excised left hip-  . Hypothyroidism   . Chest pain     7-8 yrs ago -all heart tests negative for heart issues  . Arthritis     osteoarthritis-hips, hands. history of vertebral cyst(causes numbness intermittent down legs)  . Blood dyscrasia     Dr. Jana Hakim follows"says nothing to be concerned about"    HPI:    Madison Alvarez, 70 y.o. female, has a history of pain and functional disability in the right hip(s) due to arthritis and patient has failed non-surgical conservative treatments for greater than 12 weeks to include NSAID's and/or analgesics and activity modification. Onset of symptoms was gradual starting 4+ years ago with gradually worsening course since that time.The patient noted no past surgery on the right hip(s). Patient currently rates pain in the left hip at 9 out of 10 with activity. Patient has night pain, worsening of pain with activity and weight bearing, trendelenberg gait, pain that interfers with activities of daily living and pain with passive range of motion. Patient has evidence of periarticular osteophytes and joint space narrowing by imaging studies. This condition presents safety issues increasing the risk of falls. There is no current active infection. Risks, benefits and expectations were discussed with the patient. Risks including but not limited to the risk of anesthesia, blood clots, nerve damage, blood vessel damage, failure of the prosthesis, infection and up to and including death.  Patient understand the risks, benefits and expectations and wishes to proceed with surgery.   PCP: [name]., MD   Discharged Condition: good  Hospital Course:  Patient underwent the above stated procedure on 09/01/2015. Patient tolerated the procedure well and brought to the recovery room in good condition and subsequently to the floor.  POD #1 BP: 96/54 ; Pulse: 61 ; Temp: 98 F (36.7 C) ; Resp: 16 Patient reports pain as mild, pain controlled. No events throughout the night. Already walking well with PT. Ready to be discharged home. Dorsiflexion/plantar flexion intact, incision: dressing C/D/I, no cellulitis present and compartment soft.   LABS  Basename    HGB     9.5  HCT     27.3    Discharge Exam: General appearance: alert, cooperative and no distress Extremities: Homans sign is negative, no sign of DVT, no edema, redness or tenderness in the calves or thighs and no ulcers, gangrene or trophic changes  Disposition: Home with follow up in 2 weeks   Follow-up Information    Follow up with [name], MD. Schedule an appointment as soon as possible for a visit in 2 weeks.   Specialty:  Orthopedic Surgery   Contact information:   [address] [place] [address] [id number]       Discharge Instructions    Call MD / Call 911    Complete by:  As directed   If you experience chest pain or shortness of breath, CALL 911 and be transported to the hospital emergency room.  If you develope a fever above  52 F, pus (white drainage) or increased drainage or redness at the wound, or calf pain, call your surgeon's office.     Change dressing    Complete by:  As directed   Maintain surgical dressing until follow up in the clinic. If the edges start to pull up, may reinforce with tape. If the dressing is no longer working, may remove and cover with gauze and tape, but must keep the area dry and clean.  Call with any questions or concerns.      Constipation Prevention    Complete by:  As directed   Drink plenty of fluids.  Prune juice may be helpful.  You may use a stool softener, such as Colace (over the counter) 100 mg twice a day.  Use MiraLax (over the counter) for constipation as needed.     Diet - low sodium heart healthy    Complete by:  As directed      Discharge instructions    Complete by:  As directed   Maintain surgical dressing until follow up in the clinic. If the edges start to pull up, may reinforce with tape. If the dressing is no longer working, may remove and cover with gauze and tape, but must keep the area dry and clean.  Follow up in 2 weeks at [facility]. Call with any questions or concerns.     Increase activity slowly as tolerated    Complete by:  As directed   Weight bearing as tolerated with assist device (walker, cane, etc) as directed, use it as long as suggested by your surgeon or therapist, typically at least 4-6 weeks.     TED hose    Complete by:  As directed   Use stockings (TED hose) for 2 weeks on both leg(s).  You may remove them at night for sleeping.             Medication List    TAKE these medications        aspirin 325 MG EC tablet  Take 1 tablet (325 mg total) by mouth 2 (two) times daily.     calcium-vitamin D 500-200 MG-UNIT tablet  Commonly known as:  OSCAL WITH D  Take 1 tablet by mouth daily with breakfast.     celecoxib 200 MG capsule  Commonly known as:  CELEBREX  Take 200 mg by mouth 2 (two) times daily.     docusate sodium 100 MG capsule  Commonly known as:  COLACE  Take 1 capsule (100 mg total) by mouth 2 (two) times daily.     ESTER C PO  Take 1,000 mg by mouth daily.     ferrous sulfate 325 (65 FE) MG tablet  Take 1 tablet (325 mg total) by mouth 3 (three) times daily after meals.     HYDROcodone-acetaminophen 7.5-325 MG tablet  Commonly known as:  NORCO  Take 1-2 tablets by mouth every 4 (four) hours as needed for moderate pain.      levothyroxine 100 MCG tablet  Commonly known as:  SYNTHROID, LEVOTHROID  Take 100 mcg by mouth daily before breakfast.     multivitamin with minerals Tabs tablet  Take 1 tablet by mouth daily.     polyethylene glycol packet  Commonly known as:  MIRALAX / GLYCOLAX  Take 17 g by mouth 2 (two) times daily.     tiZANidine 4 MG tablet  Commonly known as:  ZANAFLEX  Take 1 tablet (4 mg total) by mouth every 6 (six)  hours as needed for muscle spasms.         Signed: West Pugh. Lyman Balingit   PA-C  09/07/2015, 9:21 PM

## 2015-09-16 DIAGNOSIS — Z96641 Presence of right artificial hip joint: Secondary | ICD-10-CM | POA: Diagnosis not present

## 2015-09-16 DIAGNOSIS — Z471 Aftercare following joint replacement surgery: Secondary | ICD-10-CM | POA: Diagnosis not present

## 2015-10-13 DIAGNOSIS — Z471 Aftercare following joint replacement surgery: Secondary | ICD-10-CM | POA: Diagnosis not present

## 2015-10-13 DIAGNOSIS — M1611 Unilateral primary osteoarthritis, right hip: Secondary | ICD-10-CM | POA: Diagnosis not present

## 2015-10-13 DIAGNOSIS — Z96641 Presence of right artificial hip joint: Secondary | ICD-10-CM | POA: Diagnosis not present

## 2015-12-02 DIAGNOSIS — Z96641 Presence of right artificial hip joint: Secondary | ICD-10-CM | POA: Diagnosis not present

## 2015-12-02 DIAGNOSIS — Z471 Aftercare following joint replacement surgery: Secondary | ICD-10-CM | POA: Diagnosis not present

## 2015-12-16 ENCOUNTER — Other Ambulatory Visit: Payer: Self-pay | Admitting: *Deleted

## 2015-12-16 ENCOUNTER — Telehealth: Payer: Self-pay | Admitting: *Deleted

## 2015-12-16 NOTE — Telephone Encounter (Signed)
This RN spoke with pt and informed her no additional labs needed at this time- to obtain as scheduled in August.  This RN per pt's request faxed order to [facility] in [place] at [phone].

## 2015-12-16 NOTE — Telephone Encounter (Signed)
"  I have lab every March and August but this past March I had a hip replacement and did not have lab work done.  Do I skip this and have lab done at [facility] in [place] in August before I see him or do I need lab done now?  Return number Home: [phone] or mobile [phone]."

## 2016-01-25 ENCOUNTER — Other Ambulatory Visit: Payer: Self-pay | Admitting: *Deleted

## 2016-01-26 DIAGNOSIS — D472 Monoclonal gammopathy: Secondary | ICD-10-CM | POA: Diagnosis not present

## 2016-02-08 DIAGNOSIS — J339 Nasal polyp, unspecified: Secondary | ICD-10-CM | POA: Diagnosis not present

## 2016-02-08 DIAGNOSIS — R432 Parageusia: Secondary | ICD-10-CM | POA: Diagnosis not present

## 2016-02-08 DIAGNOSIS — R439 Unspecified disturbances of smell and taste: Secondary | ICD-10-CM | POA: Diagnosis not present

## 2016-02-08 DIAGNOSIS — J342 Deviated nasal septum: Secondary | ICD-10-CM | POA: Diagnosis not present

## 2016-02-08 DIAGNOSIS — H9192 Unspecified hearing loss, left ear: Secondary | ICD-10-CM | POA: Diagnosis not present

## 2016-02-08 DIAGNOSIS — H9312 Tinnitus, left ear: Secondary | ICD-10-CM | POA: Diagnosis not present

## 2016-02-09 DIAGNOSIS — Z79899 Other long term (current) drug therapy: Secondary | ICD-10-CM | POA: Diagnosis not present

## 2016-02-09 DIAGNOSIS — Z6822 Body mass index (BMI) 22.0-22.9, adult: Secondary | ICD-10-CM | POA: Diagnosis not present

## 2016-02-09 DIAGNOSIS — L728 Other follicular cysts of the skin and subcutaneous tissue: Secondary | ICD-10-CM | POA: Diagnosis not present

## 2016-02-09 DIAGNOSIS — R319 Hematuria, unspecified: Secondary | ICD-10-CM | POA: Diagnosis not present

## 2016-02-09 DIAGNOSIS — L821 Other seborrheic keratosis: Secondary | ICD-10-CM | POA: Diagnosis not present

## 2016-02-09 DIAGNOSIS — N390 Urinary tract infection, site not specified: Secondary | ICD-10-CM | POA: Diagnosis not present

## 2016-02-09 DIAGNOSIS — R300 Dysuria: Secondary | ICD-10-CM | POA: Diagnosis not present

## 2016-02-09 DIAGNOSIS — L820 Inflamed seborrheic keratosis: Secondary | ICD-10-CM | POA: Diagnosis not present

## 2016-02-09 DIAGNOSIS — L918 Other hypertrophic disorders of the skin: Secondary | ICD-10-CM | POA: Diagnosis not present

## 2016-02-28 NOTE — Progress Notes (Signed)
ID: [name] OB: [birth year]  MR#: [id number]  [id number]  PCP: Madison Alvarez., MD GYN:  Madison Alvarez SU:  OTHER MD: Madison Alvarez (PCP), Madison Alvarez, Madison Alvarez   HISTORY OF PRESENT ILLNESS: From the original intent not:  Madison Alvarez noted left hip pain sometime in January of 2014. This became progressively worse, to the point that she was "limping everywhere". She brought this to the attention of her son-in-law, who is a Restaurant manager, fast food, but he was not able to relieve the problem and suggested some films. She then was evaluated by Madison Alvarez who obtained a left hip MRI and bone scan. The left hip MRI showed a nondisplaced subchondral stress fracture of the left femoral head with mild to moderate marrow edema. There was moderate chronic arthritis of both hips and incidentally sigmoid diverticulosis was also noted. As part of the evaluation lab work was obtained and this showed an M spike of 0.46 g/dL, identified as IgG lambda. Other labs obtained at that time showed a total IgG of 1180, total IgA of 171, and total IgM of 93, all normal. Hemoglobin was 13.0, MCV 87.2, creatinine 0.72 and calcium 9.4. Albumin was 4.2 and total protein 7.0. Again all these lab work was unremarkable.  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 Since her last visit here she had a right total hip replacement under her doctor Madison Alvarez and she is very satisfied with the results. She is thinking she will have the left leg done at some point within the next year.  REVIEW OF SYSTEMS: Madison Alvarez has some left sciatica pain, but this is very inconstant. 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 Aside from these issues a detailed review of systems today was entirely benign  PAST MEDICAL HISTORY: Past Medical History:   Diagnosis Date  . 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 Blood dyscrasia    Dr. Jana Alvarez follows"says nothing to be concerned about"  . Cancer [facility])    Melanoma '02- excised left hip-  . Chest pain    7-8 yrs ago -all heart tests negative for heart issues  . Hypothyroidism    She has a remote history of meningitis and carries a diagnosis of osteoporosis  PAST SURGICAL HISTORY: Past Surgical History:  Procedure Laterality Date  . CATARACT EXTRACTION, BILATERAL    . MELANOMA EXCISION Left    '02- left hip  . TOTAL HIP ARTHROPLASTY Right 09/01/2015   Procedure: RIGHT TOTAL HIP ARTHROPLASTY ANTERIOR APPROACH;  Surgeon: Madison Cancel, MD;  Location: [facility];  Service: Orthopedics;  Laterality: Right;  . TUBAL LIGATION     Melanoma removed from the left hip skin area approximately 2005  FAMILY HISTORY No family history on file. The patient's father committed suicide when the patient was 70 years old. The patient's mother died from complications of epilepsy her in her late 90s. The patient has 2 brothers, one sister. There is no history of cancer in the family.  GYNECOLOGIC HISTORY:  Menarche age 31, first live birth age 89, the patient is [place] P2. She stopped having periods approximately 2006. She never took hormone replacement  SOCIAL HISTORY:  Madison Alvarez works at "[employer]" in [place] she has 2 children from her first marriage Madison Alvarez who lives in [place]  and works as a Warden/ranger, also living in [place], who works in Health and safety inspector. The patient has 3 grandchildren. Madison Alvarez has 2 children from a prior marriage a daughter living in [place] and a son living in [place]. Madison Alvarez has 2 grandchildren of his own. The Kains are not church attenders    ADVANCED DIRECTIVES: Not in place   HEALTH MAINTENANCE: Social History  Substance Use Topics  . Smoking status: Never Smoker  . Smokeless tobacco: Never  Used  . Alcohol use Yes     Comment: social     Colonoscopy: 2013  PAP: Due  Bone density: 2012, showing "early osteoporosis"  Lipid panel:  Allergies  Allergen Reactions  . Protein Fortified Cookie [Nutritional Supplements] Swelling    Says "sometimes happens"  . Iodinated Diagnostic Agents Hives    1980's   . Penicillins Hives and Swelling    .Marland KitchenHas patient had a PCN reaction causing immediate rash, facial/tongue/throat swelling, SOB or lightheadedness with hypotension: Yes Has patient had a PCN reaction causing severe rash involving mucus membranes or skin necrosis: No Has patient had a PCN reaction that required hospitalization No Has patient had a PCN reaction occurring within the last 10 years: No If all of the above answers are "NO", then may proceed with Cephalosporin use.     Current Outpatient Prescriptions  Medication Sig Dispense Refill  . Bioflavonoid Products (ESTER C PO) Take 1,000 mg by mouth daily.    . calcium-vitamin D (OSCAL WITH D) 500-200 MG-UNIT tablet Take 1 tablet by mouth daily with breakfast.    . celecoxib (CELEBREX) 200 MG capsule Take 200 mg by mouth 2 (two) times daily.    Marland Kitchen docusate sodium (COLACE) 100 MG capsule Take 1 capsule (100 mg total) by mouth 2 (two) times daily. 10 capsule 0  . ferrous sulfate 325 (65 FE) MG tablet Take 1 tablet (325 mg total) by mouth 3 (three) times daily after meals.  3  . levothyroxine (SYNTHROID, LEVOTHROID) 100 MCG tablet Take 100 mcg by mouth daily before breakfast.    . Multiple Vitamin (MULTIVITAMIN WITH MINERALS) TABS tablet Take 1 tablet by mouth daily.    . polyethylene glycol (MIRALAX / GLYCOLAX) packet Take 17 g by mouth 2 (two) times daily. 14 each 0  . tiZANidine (ZANAFLEX) 4 MG tablet Take 1 tablet (4 mg total) by mouth every 6 (six) hours as needed for muscle spasms. 40 tablet 0   No current facility-administered medications for this visit.     OBJECTIVE:  Middle-aged white woman who appears  well Vitals:   02/29/16 1416  BP: 131/60  Pulse: 62  Resp: 18  Temp: 97.6 F (36.4 C)     Body mass index is 21.87 kg/m.    ECOG FS: 1  Sclerae unicteric, pupils round and equal Oropharynx clear and moist-- no thrush or other lesions No cervical or supraclavicular adenopathy Lungs no rales or rhonchi Heart regular rate and rhythm Abd soft, nontender, positive bowel sounds MSK no focal spinal tenderness, no upper extremity lymphedema Neuro: nonfocal, well oriented, appropriate affect Breasts: There are no suspicious masses in either breast. Both axillae are benign.    LAB RESULTS: M spike 09/02/2014 at [facility] was  0.4 g/dL and again 02/02/2015. The kappa lambda ratio was normal at 0.55  Repeat labs at [facility] 01/26/2016 showed the M spike to actually have decreased to 0.3. The light chain ratio was normal with a Lambda ratio of 0.60. The urine protein  creatinine ratio was normal at 101. CBC was completely normal with a white cell count of 6.3 hemoglobin 14.2 and platelets 244,000. The creatinine was 0.70, the calcium 9.5, the albumin 4.3, and the alkaline phosphatase 61.  CMP     Component Value Date/Time   NA 134 (L) 09/02/2015 0403    I No results found for: SPEP  Lab Results  Component Value Date   WBC 10.7 (H) 09/02/2015      Chemistry      Component Value Date/Time   NA 134 (L) 09/02/2015 0403      Component Value Date/Time   CALCIUM 8.5 (L) 09/02/2015 0403       No results found for: LABCA2  No components found for: [id number]  No results for input(s): INR in the last 168 hours.  Urinalysis    Component Value Date/Time   COLORURINE YELLOW 08/25/2015 0834    STUDIES: Screening yearly mammography over 2  ASSESSMENT: 70 y.o. [place] woman with a 0.46 g/dL serum monoclonal spike, IgG lambda, documented 10/01/2012 in the absence of urine paraproteinemia, anemia, hypercalcemia, renal injury or lytic bone leasions  (a) by repeat  01/26/2016 shows an M spike of 0.3, normal A lambda ratio  (1) significant osteoarthritis chiefly involving hips  (a) status post right total hip replacement 09/01/2015   PLAN:  We reviewed the fact that she does have an abnormal protein in her blood. This is very small and not a malignancy. This requires only follow-up.  I think yearly lab work would be adequate. She will see me again in September 2018. She can stop in any time within a month of that visit to have her labs redrawn. We had a standing order in place.  She is a little bit behind on her mammography screening. Exam today was negative. I have ordered that for next week at [facility]  She knows to call for any other problems that may develop before her next visit here.   Chauncey Cruel, MD   02/29/2016 2:33 PM

## 2016-02-29 ENCOUNTER — Ambulatory Visit (HOSPITAL_BASED_OUTPATIENT_CLINIC_OR_DEPARTMENT_OTHER): Payer: PPO | Admitting: Oncology

## 2016-02-29 VITALS — BP 131/60 | HR 62 | Temp 97.6°F | Resp 18 | Ht 61.75 in | Wt 118.6 lb

## 2016-02-29 DIAGNOSIS — D472 Monoclonal gammopathy: Secondary | ICD-10-CM | POA: Diagnosis not present

## 2016-03-02 ENCOUNTER — Telehealth: Payer: Self-pay | Admitting: Oncology

## 2016-03-02 NOTE — Telephone Encounter (Signed)
lvm to inform pt of mammogram at [facility] 9/26 at 1245 pm and one year follow up with GM Aug 2018

## 2016-03-15 DIAGNOSIS — Z1231 Encounter for screening mammogram for malignant neoplasm of breast: Secondary | ICD-10-CM | POA: Diagnosis not present

## 2016-07-28 DIAGNOSIS — M1612 Unilateral primary osteoarthritis, left hip: Secondary | ICD-10-CM | POA: Diagnosis not present

## 2016-07-28 DIAGNOSIS — M25561 Pain in right knee: Secondary | ICD-10-CM | POA: Diagnosis not present

## 2016-07-28 DIAGNOSIS — M25552 Pain in left hip: Secondary | ICD-10-CM | POA: Diagnosis not present

## 2016-07-28 DIAGNOSIS — M25562 Pain in left knee: Secondary | ICD-10-CM | POA: Diagnosis not present

## 2016-07-28 DIAGNOSIS — M545 Low back pain: Secondary | ICD-10-CM | POA: Diagnosis not present

## 2016-08-11 DIAGNOSIS — R05 Cough: Secondary | ICD-10-CM | POA: Diagnosis not present

## 2016-08-11 DIAGNOSIS — Z79899 Other long term (current) drug therapy: Secondary | ICD-10-CM | POA: Diagnosis not present

## 2016-08-11 DIAGNOSIS — Z6821 Body mass index (BMI) 21.0-21.9, adult: Secondary | ICD-10-CM | POA: Diagnosis not present

## 2016-08-11 DIAGNOSIS — J029 Acute pharyngitis, unspecified: Secondary | ICD-10-CM | POA: Diagnosis not present

## 2016-08-19 NOTE — H&P (Signed)
TOTAL HIP ADMISSION H&P  Patient is admitted for left total hip arthroplasty, anterior approach.  Subjective:  Chief Complaint:     Left hip primary OA / pain  HPI: Madison Alvarez, 71 y.o. female, has a history of pain and functional disability in the left hip(s) due to arthritis and patient has failed non-surgical conservative treatments for greater than 12 weeks to include NSAID's and/or analgesics, corticosteriod injections and activity modification.  Onset of symptoms was gradual starting ~1 years ago with gradually worsening course since that time.The patient noted prior procedures of the hip to include arthroplasty on the right hip on 09/01/2015 per Dr. Alvan Dame.  Patient currently rates pain in the left hip at 10 out of 10 with activity. Patient has night pain, worsening of pain with activity and weight bearing, trendelenberg gait, pain that interfers with activities of daily living and pain with passive range of motion. Patient has evidence of periarticular osteophytes and joint space narrowing by imaging studies. This condition presents safety issues increasing the risk of falls.   There is no current active infection.   Risks, benefits and expectations were discussed with the patient.  Risks including but not limited to the risk of anesthesia, blood clots, nerve damage, blood vessel damage, failure of the prosthesis, infection and up to and including death.  Patient understand the risks, benefits and expectations and wishes to proceed with surgery.   PCP: [name]., MD  D/C Plans:       Home   Post-op Meds:       No Rx given  Tranexamic Acid:      To be given - IV  Decadron:      Is to be given  FYI:     ASA  Norco   DME:   Pt already has equipment  PT: No PT    Patient Active Problem List   Diagnosis Date Noted  . S/P right THA, AA 09/01/2015  . Osteoarthritis 03/02/2015  . 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 Arthritis     osteoarthritis-hips, hands. history of vertebral cyst(causes numbness intermittent down legs)  . Blood dyscrasia    Dr. Jana Hakim follows"says nothing to be concerned about"  . Cancer [facility])    Melanoma '02- excised left hip-  . Chest pain    7-8 yrs ago -all heart tests negative for heart issues  . Hypothyroidism     Past Surgical History:  Procedure Laterality Date  . CATARACT EXTRACTION, BILATERAL    . MELANOMA EXCISION Left    '02- left hip  . TOTAL HIP ARTHROPLASTY Right 09/01/2015   Procedure: RIGHT TOTAL HIP ARTHROPLASTY ANTERIOR APPROACH;  Surgeon: Paralee Cancel, MD;  Location: [facility];  Service: Orthopedics;  Laterality: Right;  . TUBAL LIGATION      No prescriptions prior to admission.   Allergies  Allergen Reactions  . Protein Fortified Cookie [Nutritional Supplements] Swelling    Says "sometimes happens"  . Iodinated Diagnostic Agents Hives    1980's   . Penicillins Hives and Swelling    .Marland KitchenHas patient had a PCN reaction causing immediate rash, facial/tongue/throat swelling, SOB or lightheadedness with hypotension: Yes Has patient had a PCN reaction causing severe rash involving mucus membranes or skin necrosis: No Has patient had a PCN reaction that required hospitalization No Has patient had a PCN reaction occurring within the last 10 years: No If all of the above answers are "NO", then may proceed with Cephalosporin  use.     Social History  Substance Use Topics  . Smoking status: Never Smoker  . Smokeless tobacco: Never Used  . Alcohol use Yes     Comment: social       Review of Systems  Constitutional: Negative.   HENT: Negative.   Eyes: Negative.   Respiratory: Negative.   Cardiovascular: Negative.   Gastrointestinal: Negative.   Genitourinary: Negative.   Musculoskeletal: Positive for joint pain.  Skin: Negative.   Neurological: Negative.   Endo/Heme/Allergies: Negative.   Psychiatric/Behavioral: Negative.     Objective:  Physical Exam   Constitutional: She is oriented to person, place, and time. She appears well-developed.  HENT:  Head: Normocephalic.  Eyes: Pupils are equal, round, and reactive to light.  Neck: Neck supple. No JVD present. No tracheal deviation present. No thyromegaly present.  Cardiovascular: Normal rate, regular rhythm, normal heart sounds and intact distal pulses.   Respiratory: Effort normal and breath sounds normal. No respiratory distress. She has no wheezes.  GI: Soft. There is no tenderness. There is no guarding.  Musculoskeletal:       Left hip: She exhibits decreased range of motion, decreased strength, tenderness and bony tenderness. She exhibits no swelling, no deformity and no laceration.  Lymphadenopathy:    She has no cervical adenopathy.  Neurological: She is alert and oriented to person, place, and time.  Skin: Skin is warm and dry.  Psychiatric: She has a normal mood and affect.      Labs:  Estimated body mass index is 21.87 kg/m as calculated from the following:   Height as of 02/29/16: 5' 1.75" (1.568 m).   Weight as of 02/29/16: 53.8 kg (118 lb 9.6 oz).   Imaging Review Plain radiographs demonstrate severe degenerative joint disease of the left hip(s). The bone quality appears to be good for age and reported activity level.  Assessment/Plan:  End stage arthritis, left hip(s)  The patient history, physical examination, clinical judgement of the provider and imaging studies are consistent with end stage degenerative joint disease of the left hip(s) and total hip arthroplasty is deemed medically necessary. The treatment options including medical management, injection therapy, arthroscopy and arthroplasty were discussed at length. The risks and benefits of total hip arthroplasty were presented and reviewed. The risks due to aseptic loosening, infection, stiffness, dislocation/subluxation,  thromboembolic complications and other imponderables were discussed.  The patient  acknowledged the explanation, agreed to proceed with the plan and consent was signed. Patient is being admitted for inpatient treatment for surgery, pain control, PT, OT, prophylactic antibiotics, VTE prophylaxis, progressive ambulation and ADL's and discharge planning.The patient is planning to be discharged home.        West Pugh Kathy Wares   PA-C  08/19/2016, 8:07 PM

## 2016-08-24 NOTE — Patient Instructions (Signed)
Madison Alvarez  08/24/2016   Your procedure is scheduled on: TUESDAY   08/30/2016   Report to [facility] Main  Entrance take [name]  elevators to 3rd floor to  [place] at  [phone]  AM.  Call this number if you have problems the morning of surgery [phone]   Remember: ONLY 1 PERSON MAY GO WITH YOU TO SHORT STAY TO GET  READY MORNING OF [name].   Do not eat food or drink liquids :After Midnight.     Take these medicines the morning of surgery with A SIP OF WATER: LEVOTHYROXINE (SYNTHROID)                                 You may not have any metal on your body including hair pins and              piercings  Do not wear jewelry, make-up, lotions, powders or perfumes, deodorant             Do not wear nail polish.  Do not shave  48 hours prior to surgery.              Men may shave face and neck.   Do not bring valuables to the hospital. [place].  Contacts, dentures or bridgework may not be worn into surgery.  Leave suitcase in the car. After surgery it may be brought to your room.                  Please read over the following fact sheets you were given: _____________________________________________________________________             [facility] - Preparing for Surgery Before surgery, you can play an important role.  Because skin is not sterile, your skin needs to be as free of germs as possible.  You can reduce the number of germs on your skin by washing with CHG (chlorahexidine gluconate) soap before surgery.  CHG is an antiseptic cleaner which kills germs and bonds with the skin to continue killing germs even after washing. Please DO NOT use if you have an allergy to CHG or antibacterial soaps.  If your skin becomes reddened/irritated stop using the CHG and inform your nurse when you arrive at Short Stay. Do not shave (including legs and underarms) for at least 48 hours prior to the first  CHG shower.  You may shave your face/neck. Please follow these instructions carefully:  1.  Shower with CHG Soap the night before surgery and the  morning of Surgery.  2.  If you choose to wash your hair, wash your hair first as usual with your  normal  shampoo.  3.  After you shampoo, rinse your hair and body thoroughly to remove the  shampoo.                           4.  Use CHG as you would any other liquid soap.  You can apply chg directly  to the skin and wash                       Gently with a scrungie or  clean washcloth.  5.  Apply the CHG Soap to your body ONLY FROM THE NECK DOWN.   Do not use on face/ open                           Wound or open sores. Avoid contact with eyes, ears mouth and genitals (private parts).                       Wash face,  Genitals (private parts) with your normal soap.             6.  Wash thoroughly, paying special attention to the area where your surgery  will be performed.  7.  Thoroughly rinse your body with warm water from the neck down.  8.  DO NOT shower/wash with your normal soap after using and rinsing off  the CHG Soap.                9.  Pat yourself dry with a clean towel.            10.  Wear clean pajamas.            11.  Place clean sheets on your bed the night of your first shower and do not  sleep with pets. Day of Surgery : Do not apply any lotions/deodorants the morning of surgery.  Please wear clean clothes to the hospital/surgery center.  FAILURE TO FOLLOW THESE INSTRUCTIONS MAY RESULT IN THE CANCELLATION OF YOUR SURGERY PATIENT SIGNATURE_________________________________  NURSE SIGNATURE__________________________________  ________________________________________________________________________   Madison Alvarez  An incentive spirometer is a tool that can help keep your lungs clear and active. This tool measures how well you are filling your lungs with each breath. Taking long deep breaths may help reverse or decrease the  chance of developing breathing (pulmonary) problems (especially infection) following:  A long period of time when you are unable to move or be active. BEFORE THE PROCEDURE   If the spirometer includes an indicator to show your best effort, your nurse or respiratory therapist will set it to a desired goal.  If possible, sit up straight or lean slightly forward. Try not to slouch.  Hold the incentive spirometer in an upright position. INSTRUCTIONS FOR USE  1. Sit on the edge of your bed if possible, or sit up as far as you can in bed or on a chair. 2. Hold the incentive spirometer in an upright position. 3. Breathe out normally. 4. Place the mouthpiece in your mouth and seal your lips tightly around it. 5. Breathe in slowly and as deeply as possible, raising the piston or the ball toward the top of the column. 6. Hold your breath for 3-5 seconds or for as long as possible. Allow the piston or ball to fall to the bottom of the column. 7. Remove the mouthpiece from your mouth and breathe out normally. 8. Rest for a few seconds and repeat Steps 1 through 7 at least 10 times every 1-2 hours when you are awake. Take your time and take a few normal breaths between deep breaths. 9. The spirometer may include an indicator to show your best effort. Use the indicator as a goal to work toward during each repetition. 10. After each set of 10 deep breaths, practice coughing to be sure your lungs are clear. If you have an incision (the cut made at the time of surgery), support your incision when coughing  by placing a pillow or rolled up towels firmly against it. Once you are able to get out of bed, walk around indoors and cough well. You may stop using the incentive spirometer when instructed by your caregiver.  RISKS AND COMPLICATIONS  Take your time so you do not get dizzy or light-headed.  If you are in pain, you may need to take or ask for pain medication before doing incentive spirometry. It is harder  to take a deep breath if you are having pain. AFTER USE  Rest and breathe slowly and easily.  It can be helpful to keep track of a log of your progress. Your caregiver can provide you with a simple table to help with this. If you are using the spirometer at home, follow these instructions: [place] IF:   You are having difficultly using the spirometer.  You have trouble using the spirometer as often as instructed.  Your pain medication is not giving enough relief while using the spirometer.  You develop fever of 100.5 F (38.1 C) or higher. SEEK IMMEDIATE MEDICAL CARE IF:   You cough up bloody sputum that had not been present before.  You develop fever of 102 F (38.9 C) or greater.  You develop worsening pain at or near the incision site. MAKE SURE YOU:   Understand these instructions.  Will watch your condition.  Will get help right away if you are not doing well or get worse. Document Released: 10/17/2006 Document Revised: 08/29/2011 Document Reviewed: 12/18/2006 ExitCare Patient Information 2014 Exit[employer], [place].   ________________________________________________________________________  WHAT IS A BLOOD TRANSFUSION? Blood Transfusion Information  A transfusion is the replacement of blood or some of its parts. Blood is made up of multiple cells which provide different functions.  Red blood cells carry oxygen and are used for blood loss replacement.  White blood cells fight against infection.  Platelets control bleeding.  Plasma helps clot blood.  Other blood products are available for specialized needs, such as hemophilia or other clotting disorders. BEFORE THE TRANSFUSION  Who gives blood for transfusions?   Healthy volunteers who are fully evaluated to make sure their blood is safe. This is blood bank blood. Transfusion therapy is the safest it has ever been in the practice of medicine. Before blood is taken from a donor, a complete history is taken  to make sure that person has no history of diseases nor engages in risky social behavior (examples are intravenous drug use or sexual activity with multiple partners). The donor's travel history is screened to minimize risk of transmitting infections, such as malaria. The donated blood is tested for signs of infectious diseases, such as HIV and hepatitis. The blood is then tested to be sure it is compatible with you in order to minimize the chance of a transfusion reaction. If you or a relative donates blood, this is often done in anticipation of surgery and is not appropriate for emergency situations. It takes many days to process the donated blood. RISKS AND COMPLICATIONS Although transfusion therapy is very safe and saves many lives, the main dangers of transfusion include:   Getting an infectious disease.  Developing a transfusion reaction. This is an allergic reaction to something in the blood you were given. Every precaution is taken to prevent this. The decision to have a blood transfusion has been considered carefully by your caregiver before blood is given. Blood is not given unless the benefits outweigh the risks. AFTER THE TRANSFUSION  Right after receiving a  blood transfusion, you will usually feel much better and more energetic. This is especially true if your red blood cells have gotten low (anemic). The transfusion raises the level of the red blood cells which carry oxygen, and this usually causes an energy increase.  The nurse administering the transfusion will monitor you carefully for complications. HOME CARE INSTRUCTIONS  No special instructions are needed after a transfusion. You may find your energy is better. Speak with your caregiver about any limitations on activity for underlying diseases you may have. SEEK MEDICAL CARE IF:   Your condition is not improving after your transfusion.  You develop redness or irritation at the intravenous (IV) site. SEEK IMMEDIATE MEDICAL CARE  IF:  Any of the following symptoms occur over the next 12 hours:  Shaking chills.  You have a temperature by mouth above 102 F (38.9 C), not controlled by medicine.  Chest, back, or muscle pain.  People around you feel you are not acting correctly or are confused.  Shortness of breath or difficulty breathing.  Dizziness and fainting.  You get a rash or develop hives.  You have a decrease in urine output.  Your urine turns a dark color or changes to pink, red, or brown. Any of the following symptoms occur over the next 10 days:  You have a temperature by mouth above 102 F (38.9 C), not controlled by medicine.  Shortness of breath.  Weakness after normal activity.  The white part of the eye turns yellow (jaundice).  You have a decrease in the amount of urine or are urinating less often.  Your urine turns a dark color or changes to pink, red, or brown. Document Released: 06/03/2000 Document Revised: 08/29/2011 Document Reviewed: 01/21/2008 [facility] Patient Information 2014 [place], [place].  _______________________________________________________________________

## 2016-08-25 ENCOUNTER — Encounter (HOSPITAL_COMMUNITY)
Admission: RE | Admit: 2016-08-25 | Discharge: 2016-08-25 | Disposition: A | Discharge status: Home / Self Care | Payer: PPO | Source: Ambulatory Visit | Attending: Orthopedic Surgery | Admitting: Orthopedic Surgery

## 2016-08-25 ENCOUNTER — Encounter (HOSPITAL_COMMUNITY): Payer: Self-pay

## 2016-08-25 DIAGNOSIS — Z01812 Encounter for preprocedural laboratory examination: Secondary | ICD-10-CM | POA: Insufficient documentation

## 2016-08-25 LAB — CBC
HCT: 38.2 % (ref 36.0–46.0)
HEMOGLOBIN: 13 g/dL (ref 12.0–15.0)
MCH: 30 pg (ref 26.0–34.0)
MCHC: 34 g/dL (ref 30.0–36.0)
MCV: 88 fL (ref 78.0–100.0)
PLATELETS: 282 10*3/uL (ref 150–400)
RBC: 4.34 MIL/uL (ref 3.87–5.11)
RDW: 12.7 % (ref 11.5–15.5)
WBC: 5.8 10*3/uL (ref 4.0–10.5)

## 2016-08-25 LAB — SURGICAL PCR SCREEN
MRSA, PCR: NEGATIVE
STAPHYLOCOCCUS AUREUS: NEGATIVE

## 2016-08-25 NOTE — Progress Notes (Signed)
08/11/2016-Pre-operative clearance from Dr. Megan Salon on chart.

## 2016-08-30 ENCOUNTER — Encounter (HOSPITAL_COMMUNITY)
Admission: RE | Disposition: A | Discharge status: Home / Self Care | Payer: Self-pay | Source: Ambulatory Visit | Attending: Orthopedic Surgery

## 2016-08-30 ENCOUNTER — Inpatient Hospital Stay (HOSPITAL_COMMUNITY): Payer: PPO

## 2016-08-30 ENCOUNTER — Inpatient Hospital Stay (HOSPITAL_COMMUNITY)
Admission: RE | Admit: 2016-08-30 | Discharge: 2016-08-31 | DRG: 470 | Disposition: A | Discharge status: Home / Self Care | Payer: PPO | Source: Ambulatory Visit | Attending: Orthopedic Surgery | Admitting: Orthopedic Surgery

## 2016-08-30 ENCOUNTER — Inpatient Hospital Stay (HOSPITAL_COMMUNITY): Payer: PPO | Admitting: Anesthesiology

## 2016-08-30 ENCOUNTER — Encounter (HOSPITAL_COMMUNITY): Payer: Self-pay | Admitting: *Deleted

## 2016-08-30 DIAGNOSIS — Z96642 Presence of left artificial hip joint: Secondary | ICD-10-CM | POA: Diagnosis not present

## 2016-08-30 DIAGNOSIS — D472 Monoclonal gammopathy: Secondary | ICD-10-CM | POA: Diagnosis not present

## 2016-08-30 DIAGNOSIS — M25552 Pain in left hip: Secondary | ICD-10-CM | POA: Diagnosis not present

## 2016-08-30 DIAGNOSIS — M1612 Unilateral primary osteoarthritis, left hip: Secondary | ICD-10-CM | POA: Diagnosis not present

## 2016-08-30 DIAGNOSIS — Z85820 Personal history of malignant melanoma of skin: Secondary | ICD-10-CM | POA: Diagnosis not present

## 2016-08-30 DIAGNOSIS — E039 Hypothyroidism, unspecified: Secondary | ICD-10-CM | POA: Diagnosis not present

## 2016-08-30 DIAGNOSIS — Z471 Aftercare following joint replacement surgery: Secondary | ICD-10-CM | POA: Diagnosis not present

## 2016-08-30 DIAGNOSIS — Z96641 Presence of right artificial hip joint: Secondary | ICD-10-CM | POA: Diagnosis present

## 2016-08-30 HISTORY — PX: TOTAL HIP ARTHROPLASTY: SHX124

## 2016-08-30 LAB — TYPE AND SCREEN
ABO/RH(D): A NEG
ANTIBODY SCREEN: NEGATIVE

## 2016-08-30 SURGERY — ARTHROPLASTY, HIP, TOTAL, ANTERIOR APPROACH
Anesthesia: Spinal | Site: Hip | Laterality: Left

## 2016-08-30 MED ORDER — MIDAZOLAM HCL 2 MG/2ML IJ SOLN: 0.5000 mg | Freq: Once | INTRAMUSCULAR | Status: DC | PRN

## 2016-08-30 MED ORDER — SUCCINYLCHOLINE CHLORIDE 200 MG/10ML IV SOSY
PREFILLED_SYRINGE | INTRAVENOUS | Status: AC
  Filled 2016-08-30: qty 10

## 2016-08-30 MED ORDER — ONDANSETRON HCL 4 MG/2ML IJ SOLN
INTRAMUSCULAR | Status: AC
  Filled 2016-08-30: qty 2

## 2016-08-30 MED ORDER — HYDROMORPHONE HCL 1 MG/ML IJ SOLN
INTRAMUSCULAR | Status: AC
  Filled 2016-08-30: qty 1

## 2016-08-30 MED ORDER — FENTANYL CITRATE (PF) 100 MCG/2ML IJ SOLN
INTRAMUSCULAR | Status: AC
  Filled 2016-08-30: qty 2

## 2016-08-30 MED ORDER — MIDAZOLAM HCL 2 MG/2ML IJ SOLN
INTRAMUSCULAR | Status: AC
  Filled 2016-08-30: qty 2

## 2016-08-30 MED ORDER — HYDROCODONE-ACETAMINOPHEN 7.5-325 MG PO TABS
1.0000 | ORAL_TABLET | ORAL | Status: DC
  Administered 2016-08-30 – 2016-08-31 (×5): 1 via ORAL
  Filled 2016-08-30: qty 2
  Filled 2016-08-30 (×5): qty 1

## 2016-08-30 MED ORDER — DEXAMETHASONE SODIUM PHOSPHATE 10 MG/ML IJ SOLN
INTRAMUSCULAR | Status: AC
  Filled 2016-08-30: qty 1

## 2016-08-30 MED ORDER — BUPIVACAINE IN DEXTROSE 0.75-8.25 % IT SOLN
INTRATHECAL | Status: DC | PRN
  Administered 2016-08-30: 1.4 mL via INTRATHECAL

## 2016-08-30 MED ORDER — CALCIUM CARBONATE-VITAMIN D 500-200 MG-UNIT PO TABS
1.0000 | ORAL_TABLET | Freq: Every day | ORAL | Status: DC
  Administered 2016-08-31: 10:00:00 1 via ORAL
  Filled 2016-08-30: qty 1

## 2016-08-30 MED ORDER — PROPOFOL 10 MG/ML IV BOLUS
INTRAVENOUS | Status: AC
  Filled 2016-08-30: qty 20

## 2016-08-30 MED ORDER — METHOCARBAMOL 500 MG PO TABS
500.0000 mg | ORAL_TABLET | Freq: Four times a day (QID) | ORAL | Status: DC | PRN
  Administered 2016-08-30 – 2016-08-31 (×2): 500 mg via ORAL
  Filled 2016-08-30 (×2): qty 1

## 2016-08-30 MED ORDER — HYDROMORPHONE HCL 1 MG/ML IJ SOLN
0.2500 mg | INTRAMUSCULAR | Status: DC | PRN
  Administered 2016-08-30 (×2): 0.5 mg via INTRAVENOUS

## 2016-08-30 MED ORDER — METOCLOPRAMIDE HCL 5 MG/ML IJ SOLN
5.0000 mg | Freq: Three times a day (TID) | INTRAMUSCULAR | Status: DC | PRN
  Administered 2016-08-30: 10 mg via INTRAVENOUS
  Filled 2016-08-30: qty 2

## 2016-08-30 MED ORDER — HYDROMORPHONE HCL 1 MG/ML IJ SOLN: 0.5000 mg | INTRAMUSCULAR | Status: DC | PRN

## 2016-08-30 MED ORDER — SODIUM CHLORIDE 0.9 % IV SOLN
1000.0000 mg | Freq: Once | INTRAVENOUS | Status: AC
  Administered 2016-08-30: 15:00:00 1000 mg via INTRAVENOUS
  Filled 2016-08-30: qty 1100

## 2016-08-30 MED ORDER — DOCUSATE SODIUM 100 MG PO CAPS
100.0000 mg | ORAL_CAPSULE | Freq: Two times a day (BID) | ORAL | Status: DC
  Administered 2016-08-30 – 2016-08-31 (×2): 100 mg via ORAL
  Filled 2016-08-30 (×2): qty 1

## 2016-08-30 MED ORDER — MENTHOL 3 MG MT LOZG: 1.0000 | LOZENGE | OROMUCOSAL | Status: DC | PRN

## 2016-08-30 MED ORDER — LEVOTHYROXINE SODIUM 100 MCG PO TABS
100.0000 ug | ORAL_TABLET | Freq: Every day | ORAL | Status: DC
  Administered 2016-08-31: 100 ug via ORAL
  Filled 2016-08-30: qty 1

## 2016-08-30 MED ORDER — VANCOMYCIN HCL IN DEXTROSE 1-5 GM/200ML-% IV SOLN
1000.0000 mg | Freq: Two times a day (BID) | INTRAVENOUS | Status: AC
  Administered 2016-08-30: 22:00:00 1000 mg via INTRAVENOUS
  Filled 2016-08-30: qty 200

## 2016-08-30 MED ORDER — MEPERIDINE HCL 50 MG/ML IJ SOLN: 6.2500 mg | INTRAMUSCULAR | Status: DC | PRN

## 2016-08-30 MED ORDER — PHENOL 1.4 % MT LIQD
1.0000 | OROMUCOSAL | Status: DC | PRN
  Filled 2016-08-30: qty 177

## 2016-08-30 MED ORDER — METHOCARBAMOL 1000 MG/10ML IJ SOLN
500.0000 mg | Freq: Four times a day (QID) | INTRAVENOUS | Status: DC | PRN
  Administered 2016-08-30: 500 mg via INTRAVENOUS
  Filled 2016-08-30: qty 5
  Filled 2016-08-30: qty 550

## 2016-08-30 MED ORDER — PROMETHAZINE HCL 25 MG/ML IJ SOLN: 6.2500 mg | INTRAMUSCULAR | Status: DC | PRN

## 2016-08-30 MED ORDER — ACETAMINOPHEN 650 MG RE SUPP: 650.0000 mg | Freq: Four times a day (QID) | RECTAL | Status: DC | PRN

## 2016-08-30 MED ORDER — FERROUS SULFATE 325 (65 FE) MG PO TABS
325.0000 mg | ORAL_TABLET | Freq: Two times a day (BID) | ORAL | Status: DC
  Filled 2016-08-30: qty 1

## 2016-08-30 MED ORDER — ONDANSETRON HCL 4 MG/2ML IJ SOLN
INTRAMUSCULAR | Status: DC | PRN
  Administered 2016-08-30: 4 mg via INTRAVENOUS

## 2016-08-30 MED ORDER — SODIUM CHLORIDE 0.9 % IR SOLN
Status: DC | PRN
  Administered 2016-08-30: 1000 mL

## 2016-08-30 MED ORDER — CELECOXIB 200 MG PO CAPS
200.0000 mg | ORAL_CAPSULE | Freq: Two times a day (BID) | ORAL | Status: DC
  Administered 2016-08-30: 22:00:00 200 mg via ORAL
  Filled 2016-08-30 (×2): qty 1

## 2016-08-30 MED ORDER — LACTATED RINGERS IV SOLN
INTRAVENOUS | Status: DC
  Administered 2016-08-30: 08:00:00 via INTRAVENOUS

## 2016-08-30 MED ORDER — DEXAMETHASONE SODIUM PHOSPHATE 10 MG/ML IJ SOLN
10.0000 mg | Freq: Once | INTRAMUSCULAR | Status: AC
  Administered 2016-08-31: 10:00:00 10 mg via INTRAVENOUS
  Filled 2016-08-30: qty 1

## 2016-08-30 MED ORDER — LIDOCAINE 2% (20 MG/ML) 5 ML SYRINGE
INTRAMUSCULAR | Status: AC
  Filled 2016-08-30: qty 5

## 2016-08-30 MED ORDER — DIPHENHYDRAMINE HCL 12.5 MG/5ML PO ELIX: 12.5000 mg | ORAL_SOLUTION | ORAL | Status: DC | PRN

## 2016-08-30 MED ORDER — POLYETHYLENE GLYCOL 3350 17 G PO PACK: 17.0000 g | PACK | Freq: Every day | ORAL | Status: DC | PRN

## 2016-08-30 MED ORDER — FENTANYL CITRATE (PF) 100 MCG/2ML IJ SOLN
INTRAMUSCULAR | Status: DC | PRN
  Administered 2016-08-30: 50 ug via INTRAVENOUS

## 2016-08-30 MED ORDER — TRANEXAMIC ACID 1000 MG/10ML IV SOLN
1000.0000 mg | INTRAVENOUS | Status: AC
  Administered 2016-08-30: 1000 mg via INTRAVENOUS
  Filled 2016-08-30: qty 1100

## 2016-08-30 MED ORDER — EPHEDRINE SULFATE-NACL 50-0.9 MG/10ML-% IV SOSY
PREFILLED_SYRINGE | INTRAVENOUS | Status: DC | PRN
  Administered 2016-08-30: 10 mg via INTRAVENOUS
  Administered 2016-08-30: 5 mg via INTRAVENOUS
  Administered 2016-08-30 (×2): 10 mg via INTRAVENOUS
  Administered 2016-08-30 (×2): 5 mg via INTRAVENOUS

## 2016-08-30 MED ORDER — METOCLOPRAMIDE HCL 5 MG PO TABS: 5.0000 mg | ORAL_TABLET | Freq: Three times a day (TID) | ORAL | Status: DC | PRN

## 2016-08-30 MED ORDER — DEXAMETHASONE SODIUM PHOSPHATE 10 MG/ML IJ SOLN
10.0000 mg | Freq: Once | INTRAMUSCULAR | Status: AC
  Administered 2016-08-30: 10 mg via INTRAVENOUS

## 2016-08-30 MED ORDER — ONDANSETRON HCL 4 MG/2ML IJ SOLN
4.0000 mg | Freq: Four times a day (QID) | INTRAMUSCULAR | Status: DC | PRN
  Administered 2016-08-30: 15:00:00 4 mg via INTRAVENOUS
  Filled 2016-08-30: qty 2

## 2016-08-30 MED ORDER — PROPOFOL 500 MG/50ML IV EMUL
INTRAVENOUS | Status: DC | PRN
Start: 2016-08-30 — End: 2016-08-30
  Administered 2016-08-30: 75 ug/kg/min via INTRAVENOUS

## 2016-08-30 MED ORDER — ASPIRIN 81 MG PO CHEW
81.0000 mg | CHEWABLE_TABLET | Freq: Two times a day (BID) | ORAL | Status: DC
  Administered 2016-08-30 – 2016-08-31 (×2): 81 mg via ORAL
  Filled 2016-08-30 (×2): qty 1

## 2016-08-30 MED ORDER — ACETAMINOPHEN 325 MG PO TABS: 650.0000 mg | ORAL_TABLET | Freq: Four times a day (QID) | ORAL | Status: DC | PRN

## 2016-08-30 MED ORDER — VANCOMYCIN HCL IN DEXTROSE 1-5 GM/200ML-% IV SOLN
1000.0000 mg | INTRAVENOUS | Status: AC
  Administered 2016-08-30: 1000 mg via INTRAVENOUS
  Filled 2016-08-30: qty 200

## 2016-08-30 MED ORDER — CHLORHEXIDINE GLUCONATE 4 % EX LIQD: 60.0000 mL | Freq: Once | CUTANEOUS | Status: DC

## 2016-08-30 MED ORDER — EPHEDRINE 5 MG/ML INJ
INTRAVENOUS | Status: AC
  Filled 2016-08-30: qty 10

## 2016-08-30 MED ORDER — MIDAZOLAM HCL 5 MG/5ML IJ SOLN
INTRAMUSCULAR | Status: DC | PRN
  Administered 2016-08-30: 2 mg via INTRAVENOUS

## 2016-08-30 MED ORDER — SODIUM CHLORIDE 0.9 % IV SOLN
INTRAVENOUS | Status: DC
  Administered 2016-08-30 – 2016-08-31 (×2): via INTRAVENOUS

## 2016-08-30 MED ORDER — ONDANSETRON HCL 4 MG PO TABS: 4.0000 mg | ORAL_TABLET | Freq: Four times a day (QID) | ORAL | Status: DC | PRN

## 2016-08-30 MED ORDER — ADULT MULTIVITAMIN W/MINERALS CH
1.0000 | ORAL_TABLET | Freq: Every day | ORAL | Status: DC
  Administered 2016-08-31: 10:00:00 1 via ORAL
  Filled 2016-08-30: qty 1

## 2016-08-30 MED ORDER — ALUM & MAG HYDROXIDE-SIMETH 200-200-20 MG/5ML PO SUSP: 30.0000 mL | ORAL | Status: DC | PRN

## 2016-08-30 SURGICAL SUPPLY — 36 items
BAG DECANTER FOR FLEXI CONT (MISCELLANEOUS) IMPLANT
BAG ZIPLOCK 12X15 (MISCELLANEOUS) IMPLANT
BLADE SAG 18X100X1.27 (BLADE) ×2 IMPLANT
BLADE SAW SGTL 11.0X1.19X90.0M (BLADE) IMPLANT
CAPT HIP TOTAL 2 ×2 IMPLANT
CLOTH BEACON ORANGE TIMEOUT ST (SAFETY) ×2 IMPLANT
COVER PERINEAL POST (MISCELLANEOUS) ×2 IMPLANT
DERMABOND ADVANCED (GAUZE/BANDAGES/DRESSINGS) ×1
DERMABOND ADVANCED .7 DNX12 (GAUZE/BANDAGES/DRESSINGS) ×1 IMPLANT
DRAPE STERI IOBAN 125X83 (DRAPES) ×2 IMPLANT
DRAPE U-SHAPE 47X51 STRL (DRAPES) ×4 IMPLANT
DRESSING AQUACEL AG SP 3.5X10 (GAUZE/BANDAGES/DRESSINGS) ×1 IMPLANT
DRSG AQUACEL AG SP 3.5X10 (GAUZE/BANDAGES/DRESSINGS) ×2
DURAPREP 26ML APPLICATOR (WOUND CARE) ×2 IMPLANT
ELECT REM PT RETURN 15FT ADLT (MISCELLANEOUS) IMPLANT
ELECT REM PT RETURN 9FT ADLT (ELECTROSURGICAL) ×2
ELECTRODE REM PT RTRN 9FT ADLT (ELECTROSURGICAL) ×1 IMPLANT
GLOVE BIOGEL M STRL SZ7.5 (GLOVE) IMPLANT
GLOVE BIOGEL PI IND STRL 7.5 (GLOVE) ×1 IMPLANT
GLOVE BIOGEL PI IND STRL 8.5 (GLOVE) ×1 IMPLANT
GLOVE BIOGEL PI INDICATOR 7.5 (GLOVE) ×1
GLOVE BIOGEL PI INDICATOR 8.5 (GLOVE) ×1
GLOVE ECLIPSE 8.0 STRL XLNG CF (GLOVE) ×4 IMPLANT
GLOVE ORTHO TXT STRL SZ7.5 (GLOVE) ×2 IMPLANT
GOWN STRL REUS W/TWL LRG LVL3 (GOWN DISPOSABLE) ×2 IMPLANT
GOWN STRL REUS W/TWL XL LVL3 (GOWN DISPOSABLE) ×2 IMPLANT
HOLDER FOLEY CATH W/STRAP (MISCELLANEOUS) ×2 IMPLANT
PACK ANTERIOR HIP CUSTOM (KITS) ×2 IMPLANT
SUT MNCRL AB 4-0 PS2 18 (SUTURE) ×2 IMPLANT
SUT VIC AB 1 CT1 36 (SUTURE) ×6 IMPLANT
SUT VIC AB 2-0 CT1 27 (SUTURE) ×2
SUT VIC AB 2-0 CT1 TAPERPNT 27 (SUTURE) ×2 IMPLANT
SUT VLOC 180 0 24IN GS25 (SUTURE) ×2 IMPLANT
TRAY FOLEY W/METER SILVER 16FR (SET/KITS/TRAYS/PACK) IMPLANT
WATER STERILE IRR 1500ML POUR (IV SOLUTION) ×2 IMPLANT
YANKAUER SUCT BULB TIP 10FT TU (MISCELLANEOUS) IMPLANT

## 2016-08-30 NOTE — Anesthesia Postprocedure Evaluation (Addendum)
Anesthesia Post Note  Patient: Madison Alvarez  Procedure(s) Performed: Procedure(s) (LRB): LEFT TOTAL HIP ARTHROPLASTY ANTERIOR APPROACH (Left)  Patient location during evaluation: PACU Anesthesia Type: Spinal Level of consciousness: awake and alert, oriented and patient cooperative Pain management: pain level controlled Vital Signs Assessment: post-procedure vital signs reviewed and stable Respiratory status: spontaneous breathing, nonlabored ventilation, respiratory function stable and patient connected to nasal cannula oxygen Cardiovascular status: blood pressure returned to baseline and stable Postop Assessment: no signs of nausea or vomiting and spinal receding Anesthetic complications: no       Last Vitals:  Vitals:   08/30/16 1600 08/30/16 1700  BP: (!) 98/56 (!) 97/54  Pulse: (!) 58 62  Resp: 14 16  Temp: 36.6 C 36.4 C    Last Pain:  Vitals:   08/30/16 1700  TempSrc: Oral  PainSc:                  Maanvi Lecompte,E. Jolena Kittle

## 2016-08-30 NOTE — Anesthesia Preprocedure Evaluation (Addendum)
Anesthesia Evaluation  Patient identified by MRN, date of birth, ID band Patient awake    Reviewed: Allergy & Precautions, NPO status , Patient's Chart, lab work & pertinent test results  History of Anesthesia Complications Negative for: history of anesthetic complications  Airway Mallampati: I  TM Distance: >3 FB Neck ROM: Full    Dental  (+) Caps, Dental Advisory Given   Pulmonary neg pulmonary ROS,    breath sounds clear to auscultation       Cardiovascular (-) anginanegative cardio ROS   Rhythm:Regular Rate:Normal     Neuro/Psych negative neurological ROS     GI/Hepatic negative GI ROS, Neg liver ROS,   Endo/Other  Hypothyroidism   Renal/GU negative Renal ROS     Musculoskeletal  (+) Arthritis , Osteoarthritis,    Abdominal   Peds  Hematology negative hematology ROS (+)   Anesthesia Other Findings   Reproductive/Obstetrics                            Anesthesia Physical Anesthesia Plan  ASA: II  Anesthesia Plan: Spinal   Post-op Pain Management:    Induction:   Airway Management Planned: Natural Airway and Simple Face Mask  Additional Equipment:   Intra-op Plan:   Post-operative Plan:   Informed Consent: I have reviewed the patients History and Physical, chart, labs and discussed the procedure including the risks, benefits and alternatives for the proposed anesthesia with the patient or authorized representative who has indicated his/her understanding and acceptance.   Dental advisory given  Plan Discussed with: CRNA and Surgeon  Anesthesia Plan Comments: (Plan routine monitors, SAB)        Anesthesia Quick Evaluation

## 2016-08-30 NOTE — Interval H&P Note (Signed)
History and Physical Interval Note:  08/30/2016 8:54 AM  Madison Alvarez  has presented today for surgery, with the diagnosis of Left hip osteoarthritis  The various methods of treatment have been discussed with the patient and family. After consideration of risks, benefits and other options for treatment, the patient has consented to  Procedure(s): LEFT TOTAL HIP ARTHROPLASTY ANTERIOR APPROACH (Left) as a surgical intervention .  The patient's history has been reviewed, patient examined, no change in status, stable for surgery.  I have reviewed the patient's chart and labs.  Questions were answered to the patient's satisfaction.     [name]

## 2016-08-30 NOTE — Progress Notes (Signed)
Portable AP Pelvis and Lateral Left Hip X-rays done. 

## 2016-08-30 NOTE — Op Note (Signed)
NAME:  Madison Alvarez                ACCOUNT NO.: [id number]      MEDICAL RECORD NO.: [id number]      FACILITY:  [facility]      PHYSICIAN:  Paralee Cancel D  DATE OF BIRTH:  05/22/1946     DATE OF PROCEDURE:  08/30/2016                                 OPERATIVE REPORT         PREOPERATIVE DIAGNOSIS: Left  hip osteoarthritis.      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      SURGEON:  Pietro Cassis. Alvan Dame, M.D.      ASSISTANT:  Nehemiah Massed, PA-C     ANESTHESIA:  Spinal.      SPECIMENS:  None.      COMPLICATIONS:  None.      BLOOD LOSS:  400 cc     DRAINS:  None.      INDICATION OF THE PROCEDURE:  Madison Alvarez is a 71 y.o. female who had   presented to office for evaluation of left hip pain.  Radiographs revealed   progressive degenerative changes with bone-on-bone   articulation to the  hip joint.  The patient had painful limited range of   motion significantly affecting their overall quality of life.  The patient was failing to    respond to conservative measures, and at this point was ready   to proceed with more definitive measures.  The patient has noted progressive   degenerative changes in his hip, progressive problems and dysfunction   with regarding the hip prior to surgery.  Consent was obtained for   benefit of pain relief.  Specific risk of infection, DVT, component   failure, dislocation, need for revision surgery, as well discussion of   the anterior versus posterior approach were reviewed.  Consent was   obtained for benefit of anterior pain relief through an anterior   approach.      PROCEDURE IN DETAIL:  The patient was brought to operative theater.   Once adequate  anesthesia, preoperative antibiotics, 1 gm of Vancomycin (PCN allergy), 1 gm of Tranexamic Acid, and 10 mg of Decadron administered.   The patient was positioned supine on the OSI [place] table.  Once adequate   padding of boney process was carried out, we had predraped out the hip, and  used fluoroscopy to confirm orientation of the pelvis and position.      The left hip was then prepped and draped from proximal iliac crest to   mid thigh with shower curtain technique.      Time-out was performed identifying the patient, planned procedure, and   extremity.     An incision was then made 2 cm distal and lateral to the   anterior superior iliac spine extending over the orientation of the   tensor fascia lata muscle and sharp dissection was carried down to the   fascia of the muscle and protractor placed in the soft tissues.  The fascia was then incised.  The muscle belly was identified and swept   laterally and retractor placed along the superior neck.  Following   cauterization of the circumflex vessels and removing some pericapsular   fat, a second cobra retractor was placed on the inferior neck.  A third   retractor was placed on the anterior acetabulum after elevating the   anterior rectus.  A L-capsulotomy was along the line of the   superior neck to the trochanteric fossa, then extended proximally and   distally.  Tag sutures were placed and the retractors were then placed   intracapsular.  We then identified the trochanteric fossa and   orientation of my neck cut, confirmed this radiographically   and then made a neck osteotomy with the femur on traction.  The femoral   head was removed without difficulty or complication.  Traction was let   off and retractors were placed posterior and anterior around the   acetabulum.      The labrum and foveal tissue were debrided.  I began reaming with a 2mm   reamer and reamed up to 49 mm reamer with good bony bed preparation and a 75mm    cup was chosen.  The final 16mm Pinnacle cup was then impacted under fluoroscopy  to confirm the depth of penetration and orientation with respect to   abduction.  A screw was placed followed by the hole eliminator.  The final   32+4 neutral Altrex liner was impacted with good visualized rim fit.  The cup was positioned anatomically within the acetabular portion of the pelvis.      At this point, the femur was rolled at 80 degrees.  Further capsule was   released off the inferior aspect of the femoral neck.  I then   released the superior capsule proximally.  The hook was placed laterally   along the femur and elevated manually and held in position with the bed   hook.  The leg was then extended and adducted with the leg rolled to 100   degrees of external rotation.  Once the proximal femur was fully   exposed, I used a box osteotome to set orientation.  I then began   broaching with the starting chili pepper broach and passed this by hand and then broached up to 5.  With the 5 broach in place I chose a high offset neck and did several trial reductions to best match the other replaced hip.  The offset was appropriate, leg lengths   appeared to be equal best matched with a +1 head ball confirmed radiographically.   Given these findings, I went ahead and dislocated the hip, repositioned all   retractors and positioned the right hip in the extended and abducted position.  The final 5 Hi Tri Lock stem was   chosen and it was impacted down to the level of neck cut.  Based on this   and the trial reduction, a 32+1 delta ceramic ball was chosen and   impacted onto a clean and dry trunnion, and the hip was reduced.  The   hip had been irrigated throughout the case again at this point.  I did   reapproximate the superior capsular leaflet to the anterior leaflet   using #1 Vicryl.  The fascia of the   tensor fascia lata muscle was then reapproximated using #1 Vicryl and #0 V-lock sutures.  The    remaining wound was closed with 2-0 Vicryl and  running 4-0 Monocryl.   The hip was cleaned, dried, and dressed sterilely using Dermabond and   Aquacel dressing.  She was then brought   to recovery room in stable condition tolerating the procedure well.    Nehemiah Massed, PA-C was present for the entirety of the case involved from   preoperative positioning, perioperative retractor management, general   facilitation of the case, as well as primary wound closure as assistant.            Pietro Cassis Alvan Dame, M.D.        08/30/2016 12:09 PM

## 2016-08-30 NOTE — Anesthesia Procedure Notes (Signed)
Spinal  Patient location during procedure: OR Start time: 08/30/2016 10:48 AM End time: 08/30/2016 10:53 AM Staffing Resident/CRNA: Danley Danker L Performed: resident/CRNA  Preanesthetic Checklist Completed: patient identified, site marked, surgical consent, pre-op evaluation, timeout performed, IV checked, risks and benefits discussed and monitors and equipment checked Spinal Block Patient position: sitting Prep: Betadine Patient monitoring: heart rate, continuous pulse ox and blood pressure Approach: midline Location: L3-4 Injection technique: single-shot Needle Needle type: Sprotte  Needle gauge: 24 G Needle length: 9 cm Additional Notes Kit expiration date 11/17/2017 and lot # [id number] Clear free flowing CSF, negative heme, negative paresthesia Returned to supine and tolerated well

## 2016-08-30 NOTE — Progress Notes (Signed)
CSW consulted for SNF placement. H & P indicates that pt is planning to return home at d/c. [facility] will assist with d/c planning. CSW will remain available to assist with SNF placement if plan changes and placement is needed.  Werner Lean LCSW [phone]

## 2016-08-30 NOTE — Progress Notes (Signed)
X-ray results noted 

## 2016-08-30 NOTE — Transfer of Care (Signed)
Immediate Anesthesia Transfer of Care Note  Patient: Madison Alvarez  Procedure(s) Performed: Procedure(s): LEFT TOTAL HIP ARTHROPLASTY ANTERIOR APPROACH (Left)  Patient Location: PACU  Anesthesia Type:Spinal  Level of Consciousness: awake, alert  and oriented  Airway & Oxygen Therapy: Patient Spontanous Breathing and Patient connected to face mask oxygen  Post-op Assessment: Report given to RN and Post -op Vital signs reviewed and stable  Post vital signs: Reviewed and stable  Last Vitals:  Vitals:   08/30/16 0705  BP: 129/70  Pulse: (!) 59  Resp: 16  Temp: 36.9 C    Last Pain:  Vitals:   08/30/16 0944  TempSrc:   PainSc: 2       Patients Stated Pain Goal: 4 (79/39/68 [id number])  Complications: No apparent anesthesia complications

## 2016-08-31 LAB — BASIC METABOLIC PANEL
ANION GAP: 6 (ref 5–15)
BUN: 11 mg/dL (ref 6–20)
CO2: 24 mmol/L (ref 22–32)
Calcium: 8.6 mg/dL — ABNORMAL LOW (ref 8.9–10.3)
Chloride: 100 mmol/L — ABNORMAL LOW (ref 101–111)
Creatinine, Ser: 0.68 mg/dL (ref 0.44–1.00)
GFR calc Af Amer: >60 mL/min (ref >60)
Glucose, Bld: 134 mg/dL — ABNORMAL HIGH (ref 65–99)
POTASSIUM: 4.4 mmol/L (ref 3.5–5.1)
Sodium: 130 mmol/L — ABNORMAL LOW (ref 135–145)

## 2016-08-31 LAB — CBC
HCT: 29 % — ABNORMAL LOW (ref 36.0–46.0)
Hemoglobin: 10.1 g/dL — ABNORMAL LOW (ref 12.0–15.0)
MCH: 29.9 pg (ref 26.0–34.0)
MCHC: 34.8 g/dL (ref 30.0–36.0)
MCV: 85.8 fL (ref 78.0–100.0)
PLATELETS: 231 10*3/uL (ref 150–400)
RBC: 3.38 MIL/uL — AB (ref 3.87–5.11)
RDW: 12.4 % (ref 11.5–15.5)
WBC: 8.4 10*3/uL (ref 4.0–10.5)

## 2016-08-31 MED ORDER — FERROUS SULFATE 325 (65 FE) MG PO TABS: 325.0000 mg | ORAL_TABLET | Freq: Two times a day (BID) | ORAL | 0 refills | Status: DC

## 2016-08-31 MED ORDER — ASPIRIN 81 MG PO CHEW
81.0000 mg | CHEWABLE_TABLET | Freq: Two times a day (BID) | ORAL | 0 refills | Status: DC
Start: 2016-08-31 — End: 2017-03-20

## 2016-08-31 MED ORDER — HYDROCODONE-ACETAMINOPHEN 7.5-325 MG PO TABS: 1.0000 | ORAL_TABLET | ORAL | 0 refills | Status: DC

## 2016-08-31 MED ORDER — CELECOXIB 200 MG PO CAPS: 200.0000 mg | ORAL_CAPSULE | Freq: Every day | ORAL | 0 refills | Status: DC

## 2016-08-31 MED ORDER — METHOCARBAMOL 500 MG PO TABS: 500.0000 mg | ORAL_TABLET | Freq: Four times a day (QID) | ORAL | 0 refills | Status: DC | PRN

## 2016-08-31 NOTE — Progress Notes (Signed)
Physical Therapy Treatment Patient Details Name: Madison Alvarez MRN: [id number] DOB: Feb 18, 1946 Today's Date: 08/31/2016    History of Present Illness 71 yo female s/p L THA-direct anterior. R THA    PT Comments    Progressing well with mobility. Practiced stair negotiation. Issued HEP for pt to perform 2x/day until f/u with ortho MD. Ready to d/c from PT standpoint-made RN aware.    Follow Up Recommendations  No PT follow up     Equipment Recommendations  None recommended by PT    Recommendations for Other Services       Precautions / Restrictions Precautions Precautions: Fall Restrictions Weight Bearing Restrictions: No LLE Weight Bearing: Weight bearing as tolerated    Mobility  Bed Mobility               General bed mobility comments: oob in recliner  Transfers Overall transfer level: Needs assistance Equipment used: Rolling walker (2 wheeled) Transfers: Sit to/from Stand Sit to Stand: Supervision         General transfer comment: for safety.   Ambulation/Gait Ambulation/Gait assistance: Supervision Ambulation Distance (Feet): 150 Feet Assistive device: Rolling walker (2 wheeled) Gait Pattern/deviations: Step-through pattern;Decreased stride length     General Gait Details: for safety.    Stairs Stairs: Yes   Stair Management: One rail Left;Step to pattern;Forwards Number of Stairs: 8 ( 4 steps x 2) General stair comments: Practiced x 1 with handrail on L. Practiced x 1 with 1 HHA. VCs safety, sequence.   Wheelchair Mobility    Modified Rankin (Stroke Patients Only)       Balance                                    Cognition Arousal/Alertness: Awake/alert Behavior During Therapy: WFL for tasks assessed/performed Overall Cognitive Status: Within Functional Limits for tasks assessed                      Exercises     General Comments        Pertinent Vitals/Pain Pain Assessment: 0-10 Pain Score: 3   Pain Location: L thigh Pain Descriptors / Indicators: Sore Pain Intervention(s): Monitored during session;Repositioned    Home Living Family/patient expects to be discharged to:: Private residence Living Arrangements: Spouse/significant other Available Help at Discharge: Family;Available 24 hours/day Type of Home: House Home Access: Stairs to enter Entrance Stairs-Rails: Right Home Layout: Bed/bath upstairs Home Equipment: Walker - 2 wheels      Prior Function Level of Independence: Independent      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   PT Goals (current goals can now be found in the care plan section) Acute Rehab PT Goals Patient Stated Goal: to go home soon. PT Goal Formulation: With patient Time For Goal Achievement: 09/14/16 Potential to Achieve Goals: Good Progress towards PT goals: Progressing toward goals    Frequency    7X/week      PT Plan Current plan remains appropriate    Co-evaluation             End of Session   Activity Tolerance: Patient tolerated treatment well Patient left: in chair;with call bell/phone within reach;with family/visitor present   PT Visit Diagnosis: Muscle weakness (generalized) (M62.81);Difficulty in walking, not elsewhere classified (R26.2)     Time: 6269-4854 PT Time Calculation (min) (ACUTE ONLY): 17 min  Charges:  $Gait  Training: 8-22 mins                    G Codes:       Weston Anna, MPT Pager: [phone]

## 2016-08-31 NOTE — Evaluation (Signed)
Physical Therapy Evaluation Patient Details Name: Madison Alvarez MRN: [id number] DOB: Dec 12, 1945 Today's Date: 08/31/2016   History of Present Illness  71 yo female s/p L THA-direct anterior. R THA  Clinical Impression  On eval, pt was supervision level assist for mobility. She walked ~150 feet with a RW. Pain rated 3/10 with activity. Will plan to have a 2nd session to practice stair negotiation prior to d/c later today.     Follow Up Recommendations No PT follow up    Equipment Recommendations  None recommended by PT    Recommendations for Other Services       Precautions / Restrictions Precautions Precautions: None;Fall Restrictions Weight Bearing Restrictions: No LLE Weight Bearing: Weight bearing as tolerated      Mobility  Bed Mobility               General bed mobility comments: oob in recliner-OT finishing session  Transfers Overall transfer level: Needs assistance Equipment used: Rolling walker (2 wheeled) Transfers: Sit to/from Stand Sit to Stand: Supervision         General transfer comment: for safety.   Ambulation/Gait Ambulation/Gait assistance: Supervision Ambulation Distance (Feet): 150 Feet Assistive device: Rolling walker (2 wheeled) Gait Pattern/deviations: Step-to pattern;Step-through pattern;Decreased stride length     General Gait Details: close guard for safety.   Stairs            Wheelchair Mobility    Modified Rankin (Stroke Patients Only)       Balance                                             Pertinent Vitals/Pain Pain Assessment: 0-10 Pain Score: 3  Pain Location: L thigh Pain Descriptors / Indicators: Sore Pain Intervention(s): Monitored during session;Repositioned;Ice applied    Home Living Family/patient expects to be discharged to:: Private residence Living Arrangements: Spouse/significant other Available Help at Discharge: Family;Available 24 hours/day Type of Home: House Home  Access: Stairs to enter Entrance Stairs-Rails: Right Entrance Stairs-Number of Steps: 3+3 Home Layout: Bed/bath upstairs Home Equipment: Walker - 2 wheels      Prior Function Level of Independence: Independent         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     Hand Dominance        Extremity/Trunk Assessment   Upper Extremity Assessment Upper Extremity Assessment: Defer to OT evaluation    Lower Extremity Assessment Lower Extremity Assessment: Generalized weakness (s/p L THA)    Cervical / Trunk Assessment Cervical / Trunk Assessment: Normal  Communication   Communication: No difficulties  Cognition Arousal/Alertness: Awake/alert Behavior During Therapy: WFL for tasks assessed/performed Overall Cognitive Status: Within Functional Limits for tasks assessed                      General Comments      Exercises Total Joint Exercises Ankle Circles/Pumps: AROM;Both;10 reps;Seated Quad Sets: AROM;Both;10 reps;Seated Hip ABduction/ADduction: AROM;Left;10 reps;Standing Straight Leg Raises: AROM;Left;10 reps;Standing Long Arc Quad: AROM;Left;10 reps;Standing Knee Flexion: AROM;Left;10 reps;Standing Marching in Standing: AROM;Both;10 reps;Standing General Exercises - Lower Extremity Heel Raises: AROM;Both;10 reps;Standing   Assessment/Plan    PT Assessment Patient needs continued PT services  PT Problem List Decreased strength;Decreased mobility;Decreased range of motion;Decreased activity tolerance;Decreased balance;Decreased knowledge of use of DME;Pain       PT Treatment Interventions  DME instruction;Gait training;Therapeutic exercise;Therapeutic activities;Patient/family education;Stair training;Functional mobility training    PT Goals (Current goals can be found in the Care Plan section)  Acute Rehab PT Goals Patient Stated Goal: to go home soon. PT Goal Formulation: With patient Time For Goal Achievement:  09/14/16 Potential to Achieve Goals: Good    Frequency 7X/week   Barriers to discharge        Co-evaluation               End of Session   Activity Tolerance: Patient tolerated treatment well Patient left: in chair;with call bell/phone within reach   PT Visit Diagnosis: Muscle weakness (generalized) (M62.81);Difficulty in walking, not elsewhere classified (R26.2)         Time: 3976-7341 PT Time Calculation (min) (ACUTE ONLY): 17 min   Charges:   PT Evaluation $PT Eval Low Complexity: 1 Procedure     PT G Codes:         Weston Anna, MPT Pager: [phone]

## 2016-08-31 NOTE — Discharge Instructions (Signed)

## 2016-08-31 NOTE — Care Management Note (Signed)
Case Management Note  Patient Details  Name: Madison Alvarez MRN: [id number] Date of Birth: 1945-11-04  Subjective/Objective:                  LEFT TOTAL HIP ARTHROPLASTY ANTERIOR APPROACH (Left) Action/Plan: Discharge planning Expected Discharge Date:  08/31/16               Expected Discharge Plan:  Home/Self Care  In-House Referral:  NA  Discharge planning Services     Post Acute Care Choice:  NA Choice offered to:  Patient, NA  DME Arranged:  N/A DME Agency:  NA  HH Arranged:  NA HH Agency:  NA  Status of Service:  Completed, signed off  If discussed at [place] of Stay Meetings, dates discussed:    Additional Comments: CM met with pt to confirm plan is for no [facility] services; pt confirms. Pt states she has all the DME needed at home. NO other CM needs were communicated. Dellie Catholic, RN 08/31/2016, 1:08 PM

## 2016-08-31 NOTE — Progress Notes (Signed)
Patient ID: Madison Alvarez, female   DOB: [birth year], 71 y.o.   MRN: [id number] Subjective: 1 Day Post-Op Procedure(s) (LRB): LEFT TOTAL HIP ARTHROPLASTY ANTERIOR APPROACH (Left)    Patient reports pain as mild.  Doing fairly well, but not much activity as of this am with PT. Has had her right hip replaced a year ago, understands processes so we reviewed  Objective:   VITALS:   Vitals:   08/31/16 0358 08/31/16 1022  BP: (!) 101/53 (!) 101/51  Pulse: (!) 52 (!) 59  Resp: 16   Temp: 97.5 F (36.4 C) 97.5 F (36.4 C)    Neurovascular intact Incision: dressing C/D/I  LABS  Recent Labs  08/31/16 0428  HGB 10.1*  HCT 29.0*  WBC 8.4  PLT 231     Recent Labs  08/31/16 0428  NA 130*  K 4.4  BUN 11  CREATININE 0.68  GLUCOSE 134*    No results for input(s): LABPT, INR in the last 72 hours.   Assessment/Plan: 1 Day Post-Op Procedure(s) (LRB): LEFT TOTAL HIP ARTHROPLASTY ANTERIOR APPROACH (Left)   Advance diet Up with therapy   Plan for D/C to home this pm after therapy RTC in 2 weeks Norco for pain ASA for DVT prophylaxis

## 2016-08-31 NOTE — Evaluation (Signed)
Occupational Therapy Evaluation and Discharge Summary Patient Details Name: Madison Alvarez MRN: [id number] DOB: 04/11/1946 Today's Date: 08/31/2016    History of Present Illness Pt is a 71yo female admitted for L anterior THR.  Pt had R anterior hip 1 year ago.   Clinical Impression   Pt seen for above diagnosis and overall is doing very well with adls. Pt had this surgery to RLE 1 year ago and knows the adl routine.  Pt's husband is there to help her 24/7.  Currently, pt only needs min assist with LE dressing and otherwise is fairly independent with basic adls.  No further OT recommended at this time.  Pt has all necessary equipment at home.     Follow Up Recommendations  No OT follow up;Supervision - Intermittent    Equipment Recommendations  None recommended by OT    Recommendations for Other Services       Precautions / Restrictions Precautions Precautions: Anterior Hip Restrictions Weight Bearing Restrictions: No      Mobility Bed Mobility Overal bed mobility: Modified Independent             General bed mobility comments: Did not use bed rails.  Given extra time.  Transfers Overall transfer level: Modified independent Equipment used: Rolling walker (2 wheeled)             General transfer comment: No physical assist needed.  Hand placement was correct.    Balance Overall balance assessment: Needs assistance Sitting-balance support: Feet supported Sitting balance-Leahy Scale: Good     Standing balance support: Bilateral upper extremity supported;During functional activity Standing balance-Leahy Scale: Fair Standing balance comment: Pt able to support self w/o walker for short amounts of time while grooming and toileting.                            ADL Overall ADL's : Needs assistance/impaired Eating/Feeding: Independent;Sitting   Grooming: Supervision/safety;Standing   Upper Body Bathing: Set up;Sitting   Lower Body Bathing: Minimal  assistance;Sit to/from stand   Upper Body Dressing : Set up;Sitting   Lower Body Dressing: Minimal assistance;Sit to/from stand Lower Body Dressing Details (indicate cue type and reason): assist donning L sock and shoe.  Pt's husband will assist with this for a few days. Toilet Transfer: Supervision/safety;Comfort height toilet;RW   Writer and Hygiene: Supervision/safety;Sit to/from stand   Tub/ Shower Transfer: Tub transfer;Minimal assistance;Ambulation;Rolling walker;Cueing for safety   Functional mobility during ADLs: Supervision/safety;Rolling walker General ADL Comments: Pt familiar with adl routine from previous hip surgery.  Only requiring assist for L sock and shoes.     Vision Baseline Vision/History: No visual deficits Patient Visual Report: No change from baseline Vision Assessment?: No apparent visual deficits     Perception     Praxis      Pertinent Vitals/Pain Pain Assessment: 0-10 Pain Score: 2  Pain Location: r hip Pain Descriptors / Indicators: Sore Pain Intervention(s): Limited activity within patient's tolerance;Monitored during session;Repositioned     Hand Dominance Right   Extremity/Trunk Assessment Upper Extremity Assessment Upper Extremity Assessment: Overall WFL for tasks assessed   Lower Extremity Assessment Lower Extremity Assessment: Defer to PT evaluation   Cervical / Trunk Assessment Cervical / Trunk Assessment: Normal   Communication Communication Communication: No difficulties   Cognition Arousal/Alertness: Awake/alert Behavior During Therapy: WFL for tasks assessed/performed Overall Cognitive Status: Within Functional Limits for tasks assessed  General Comments       Exercises       Shoulder Instructions      Home Living Family/patient expects to be discharged to:: Private residence Living Arrangements: Spouse/significant other Available Help at Discharge:  Family;Available 24 hours/day Type of Home: House Home Access: Stairs to enter [employer] of Steps: 6 w platform in between Entrance Stairs-Rails: None Home Layout: Multi-level Alternate Level Stairs-Number of Steps: 12 Alternate Level Stairs-Rails: Left Bathroom Shower/Tub: Tub/shower unit;Curtain Shower/tub characteristics: Architectural technologist: Standard (comfort) Bathroom Accessibility: Yes How Accessible: Accessible via walker Home Equipment: Walker - 2 wheels          Prior Functioning/Environment Level of Independence: Independent        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        OT Problem List:        OT Treatment/Interventions:      OT Goals(Current goals can be found in the care plan section) Acute Rehab OT Goals Patient Stated Goal: to go home soon. OT Goal Formulation: All assessment and education complete, DC therapy  OT Frequency:     Barriers to D/C:            Co-evaluation              End of Session Equipment Utilized During Treatment: Rolling walker Nurse Communication: Mobility status  Activity Tolerance: Patient tolerated treatment well Patient left: in chair;with call bell/phone within reach  OT Visit Diagnosis: Unsteadiness on feet (R26.81)                ADL either performed or assessed with clinical judgement  Time: 0927-0949 OT Time Calculation (min): 22 min Charges:  OT General Charges $OT Visit: 1 Procedure OT Evaluation $OT Eval Low Complexity: 1 Procedure G-Codes:     Jinger Neighbors, OTR/L [phone]  Glenford Peers 08/31/2016, 10:02 AM

## 2016-09-07 NOTE — Discharge Summary (Signed)
Physician Discharge Summary  Patient ID: BREAUNNA GOTTLIEB MRN: [id number] DOB/AGE: 11/04/1945 71 y.o.  Admit date: 08/30/2016 Discharge date: 08/31/2016   Procedures:  Procedure(s) (LRB): LEFT TOTAL HIP ARTHROPLASTY ANTERIOR APPROACH (Left)  Attending Physician:  Dr. Paralee Cancel   Admission Diagnoses:   Left hip primary OA / pain  Discharge Diagnoses:  Active Problems:   Status post total hip replacement, left  Past Medical History:  Diagnosis Date  . 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 Blood dyscrasia    Dr. Jana Hakim follows"says nothing to be concerned about"  . Cancer [facility])    Melanoma '02- excised left hip-  . Chest pain    7-8 yrs ago -all heart tests negative for heart issues  . Hypothyroidism     HPI:    Madison Alvarez, 71 y.o. female, has a history of pain and functional disability in the left hip(s) due to arthritis and patient has failed non-surgical conservative treatments for greater than 12 weeks to include NSAID's and/or analgesics, corticosteriod injections and activity modification.  Onset of symptoms was gradual starting ~1 years ago with gradually worsening course since that time.The patient noted prior procedures of the hip to include arthroplasty on the right hip on 09/01/2015 per Dr. Alvan Dame.  Patient currently rates pain in the left hip at 10 out of 10 with activity. Patient has night pain, worsening of pain with activity and weight bearing, trendelenberg gait, pain that interfers with activities of daily living and pain with passive range of motion. Patient has evidence of periarticular osteophytes and joint space narrowing by imaging studies. This condition presents safety issues increasing the risk of falls.  There is no current active infection.   Risks, benefits and expectations were discussed with the patient.  Risks including but not limited to the risk of anesthesia, blood clots, nerve  damage, blood vessel damage, failure of the prosthesis, infection and up to and including death.  Patient understand the risks, benefits and expectations and wishes to proceed with surgery.  PCP: [name]., MD   Discharged Condition: good  Hospital Course:  Patient underwent the above stated procedure on 08/30/2016. Patient tolerated the procedure well and brought to the recovery room in good condition and subsequently to the floor.  POD #1 BP: 101/51 ; Pulse: 59 ; Temp: 97.5 F (36.4 C) ; Resp: 16 Patient reports pain as mild.  Doing fairly well, but not much activity as of this am with PT. Has had her right hip replaced a year ago, understands processes so we reviewed. Neurovascular intact and incision: dressing C/D/I.  LABS  Basename    HGB     10.1  HCT     29.0    Discharge Exam: General appearance: alert, cooperative and no distress Extremities: Homans sign is negative, no sign of DVT, no edema, redness or tenderness in the calves or thighs and no ulcers, gangrene or trophic changes  Disposition: Home with follow up in 2 weeks   Follow-up Information    [name], MD. Schedule an appointment as soon as possible for a visit in 2 week(s).   Specialty:  Orthopedic Surgery Contact information: [address] [place] [address] [phone]           Discharge Instructions    Call MD / Call 911    Complete by:  As directed    If you experience chest pain or shortness of  breath, CALL 911 and be transported to the hospital emergency room.  If you develope a fever above 101 F, pus (white drainage) or increased drainage or redness at the wound, or calf pain, call your surgeon's office.   Constipation Prevention    Complete by:  As directed    Drink plenty of fluids.  Prune juice may be helpful.  You may use a stool softener, such as Colace (over the counter) 100 mg twice a day.  Use MiraLax (over the counter) for constipation as needed.    Diet - low sodium heart healthy    Complete by:  As directed    Discharge instructions    Complete by:  As directed    INSTRUCTIONS AFTER JOINT REPLACEMENT   Remove items at home which could result in a fall. This includes throw rugs or furniture in walking pathways ICE to the affected joint every three hours while awake for 30 minutes at a time, for at least the first 3-5 days, and then as needed for pain and swelling.  Continue to use ice for pain and swelling. You may notice swelling that will progress down to the foot and ankle.  This is normal after surgery.  Elevate your leg when you are not up walking on it.   Continue to use the breathing machine you got in the hospital (incentive spirometer) which will help keep your temperature down.  It is common for your temperature to cycle up and down following surgery, especially at night when you are not up moving around and exerting yourself.  The breathing machine keeps your lungs expanded and your temperature down.   DIET:  As you were doing prior to hospitalization, we recommend a well-balanced diet.  DRESSING / WOUND CARE / SHOWERING  Keep the surgical dressing until follow up.  The dressing is water proof, so you can shower without any extra covering.  IF THE DRESSING FALLS OFF or the wound gets wet inside, change the dressing with sterile gauze.  Please use good hand washing techniques before changing the dressing.  Do not use any lotions or creams on the incision until instructed by your surgeon.    ACTIVITY  Increase activity slowly as tolerated, but follow the weight bearing instructions below.   No driving for 6 weeks or until further direction given by your physician.  You cannot drive while taking narcotics.  No lifting or carrying greater than 10 lbs. until further directed by your surgeon. Avoid periods of inactivity such as sitting longer than an hour when not asleep. This helps prevent blood clots.  You may return to work once  you are authorized by your doctor.     WEIGHT BEARING   Weight bearing as tolerated with assist device (walker, cane, etc) as directed, use it as long as suggested by your surgeon or therapist, typically at least 4-6 weeks.   EXERCISES  Results after joint replacement surgery are often greatly improved when you follow the exercise, range of motion and muscle strengthening exercises prescribed by your doctor. Safety measures are also important to protect the joint from further injury. Any time any of these exercises cause you to have increased pain or swelling, decrease what you are doing until you are comfortable again and then slowly increase them. If you have problems or questions, call your caregiver or physical therapist for advice.   Rehabilitation is important following a joint replacement. After just a few days of immobilization, the muscles of the leg can  become weakened and shrink (atrophy).  These exercises are designed to build up the tone and strength of the thigh and leg muscles and to improve motion. Often times heat used for twenty to thirty minutes before working out will loosen up your tissues and help with improving the range of motion but do not use heat for the first two weeks following surgery (sometimes heat can increase post-operative swelling).   These exercises can be done on a training (exercise) mat, on the floor, on a table or on a bed. Use whatever works the best and is most comfortable for you.    Use music or television while you are exercising so that the exercises are a pleasant break in your day. This will make your life better with the exercises acting as a break in your routine that you can look forward to.   Perform all exercises about fifteen times, three times per day or as directed.  You should exercise both the operative leg and the other leg as well.  Exercises include:   Quad Sets - Tighten up the muscle on the front of the thigh (Quad) and hold for 5-10  seconds.   Straight Leg Raises - With your knee straight (if you were given a brace, keep it on), lift the leg to 60 degrees, hold for 3 seconds, and slowly lower the leg.  Perform this exercise against resistance later as your leg gets stronger.  Leg Slides: Lying on your back, slowly slide your foot toward your buttocks, bending your knee up off the floor (only go as far as is comfortable). Then slowly slide your foot back down until your leg is flat on the floor again.  Angel Wings: Lying on your back spread your legs to the side as far apart as you can without causing discomfort.  Hamstring Strength:  Lying on your back, push your heel against the floor with your leg straight by tightening up the muscles of your buttocks.  Repeat, but this time bend your knee to a comfortable angle, and push your heel against the floor.  You may put a pillow under the heel to make it more comfortable if necessary.   A rehabilitation program following joint replacement surgery can speed recovery and prevent re-injury in the future due to weakened muscles. Contact your doctor or a physical therapist for more information on knee rehabilitation.    CONSTIPATION  Constipation is defined medically as fewer than three stools per week and severe constipation as less than one stool per week.  Even if you have a regular bowel pattern at home, your normal regimen is likely to be disrupted due to multiple reasons following surgery.  Combination of anesthesia, postoperative narcotics, change in appetite and fluid intake all can affect your bowels.   YOU MUST use at least one of the following options; they are listed in order of increasing strength to get the job done.  They are all available over the counter, and you may need to use some, POSSIBLY even all of these options:    Drink plenty of fluids (prune juice may be helpful) and high fiber foods Colace 100 mg by mouth twice a day  Senokot for constipation as directed and  as needed Dulcolax (bisacodyl), take with full glass of water  Miralax (polyethylene glycol) once or twice a day as needed.  If you have tried all these things and are unable to have a bowel movement in the first 3-4 days after surgery call  either your surgeon or your primary doctor.    If you experience loose stools or diarrhea, hold the medications until you stool forms back up.  If your symptoms do not get better within 1 week or if they get worse, check with your doctor.  If you experience "the worst abdominal pain ever" or develop nausea or vomiting, please contact the office immediately for further recommendations for treatment.   ITCHING:  If you experience itching with your medications, try taking only a single pain pill, or even half a pain pill at a time.  You can also use Benadryl over the counter for itching or also to help with sleep.   TED HOSE STOCKINGS:  Use stockings on both legs until for at least 2 weeks or as directed by physician office. They may be removed at night for sleeping.  MEDICATIONS:  See your medication summary on the "After Visit Summary" that nursing will review with you.  You may have some home medications which will be placed on hold until you complete the course of blood thinner medication.  It is important for you to complete the blood thinner medication as prescribed.  PRECAUTIONS:  If you experience chest pain or shortness of breath - call 911 immediately for transfer to the hospital emergency department.   If you develop a fever greater that 101 F, purulent drainage from wound, increased redness or drainage from wound, foul odor from the wound/dressing, or calf pain - CONTACT YOUR SURGEON.                                                   FOLLOW-UP APPOINTMENTS:  If you do not already have a post-op appointment, please call the office for an appointment to be seen by your surgeon.  Guidelines for how soon to be seen are listed in your "After Visit Summary",  but are typically between 1-4 weeks after surgery.   MAKE SURE YOU:  Understand these instructions.  Get help right away if you are not doing well or get worse.    Thank you for letting us be a part of your medical care team.  It is a privilege we respect greatly.  We hope these instructions will help you stay on track for a fast and full recovery!   Increase activity slowly as tolerated    Complete by:  As directed       Allergies as of 08/31/2016      Reactions   Protein Fortified Cookie [nutritional Supplements] Hives   Protein supplements cause hives if taken while heart is elevated.    Iodinated Diagnostic Agents Hives   1980's    Penicillins Hives, Swelling   Has patient had a PCN reaction causing immediate rash, facial/tongue/throat swelling, SOB or lightheadedness with hypotension: Yes Has patient had a PCN reaction causing severe rash involving mucus membranes or skin necrosis: No Has patient had a PCN reaction that required hospitalization: No Has patient had a PCN reaction occurring within the last 10 years: No If all of the above answers are "NO", then may proceed with Cephalosporin use.      Medication List    TAKE these medications   aspirin 81 MG chewable tablet Chew 1 tablet (81 mg total) by mouth 2 (two) times daily.   calcium-vitamin D 500-200 MG-UNIT tablet Commonly known as:  OSCAL WITH D Take 1 tablet by mouth daily with breakfast.   celecoxib 200 MG capsule Commonly known as:  CELEBREX Take 1 capsule (200 mg total) by mouth daily.   ESTER C PO Take 1 tablet by mouth daily.   ferrous sulfate 325 (65 FE) MG tablet Take 1 tablet (325 mg total) by mouth 2 (two) times daily with a meal.   HYDROcodone-acetaminophen 7.5-325 MG tablet Commonly known as:  NORCO Take 1-2 tablets by mouth every 4 (four) hours.   levothyroxine 100 MCG tablet Commonly known as:  SYNTHROID, LEVOTHROID Take 100 mcg by mouth daily before breakfast.   methocarbamol 500 MG  tablet Commonly known as:  ROBAXIN Take 1 tablet (500 mg total) by mouth every 6 (six) hours as needed for muscle spasms.   multivitamin with minerals Tabs tablet Take 1 tablet by mouth daily.   sodium chloride 0.65 % Soln nasal spray Commonly known as:  OCEAN Place 1 spray into both nostrils every 2 (two) hours as needed for congestion.        Signed: West Pugh. Ernst Cumpston   PA-C  09/07/2016, 2:13 PM

## 2016-10-12 DIAGNOSIS — Z471 Aftercare following joint replacement surgery: Secondary | ICD-10-CM | POA: Diagnosis not present

## 2016-10-12 DIAGNOSIS — Z96642 Presence of left artificial hip joint: Secondary | ICD-10-CM | POA: Diagnosis not present

## 2016-11-07 DIAGNOSIS — Z961 Presence of intraocular lens: Secondary | ICD-10-CM | POA: Diagnosis not present

## 2016-12-14 DIAGNOSIS — Z96642 Presence of left artificial hip joint: Secondary | ICD-10-CM | POA: Diagnosis not present

## 2016-12-14 DIAGNOSIS — M25551 Pain in right hip: Secondary | ICD-10-CM | POA: Diagnosis not present

## 2016-12-14 DIAGNOSIS — M25552 Pain in left hip: Secondary | ICD-10-CM | POA: Diagnosis not present

## 2016-12-14 DIAGNOSIS — Z471 Aftercare following joint replacement surgery: Secondary | ICD-10-CM | POA: Diagnosis not present

## 2017-01-02 NOTE — Addendum Note (Signed)
Addendum  created 01/02/17 1718 by Annye Asa, MD   Sign clinical note

## 2017-01-27 ENCOUNTER — Other Ambulatory Visit: Payer: Self-pay

## 2017-01-27 DIAGNOSIS — D472 Monoclonal gammopathy: Secondary | ICD-10-CM

## 2017-01-30 ENCOUNTER — Ambulatory Visit (HOSPITAL_BASED_OUTPATIENT_CLINIC_OR_DEPARTMENT_OTHER): Payer: PPO | Admitting: Oncology

## 2017-01-30 ENCOUNTER — Other Ambulatory Visit (HOSPITAL_BASED_OUTPATIENT_CLINIC_OR_DEPARTMENT_OTHER): Payer: PPO

## 2017-01-30 ENCOUNTER — Encounter: Payer: Self-pay | Admitting: Oncology

## 2017-01-30 VITALS — BP 138/68 | HR 55 | Temp 98.7°F | Resp 18 | Ht 61.75 in | Wt 116.4 lb

## 2017-01-30 DIAGNOSIS — D472 Monoclonal gammopathy: Secondary | ICD-10-CM

## 2017-01-30 LAB — CBC WITH DIFFERENTIAL/PLATELET
BASO%: 0.8 % (ref 0.0–2.0)
Basophils Absolute: 0 10*3/uL (ref 0.0–0.1)
EOS%: 5.1 % (ref 0.0–7.0)
Eosinophils Absolute: 0.3 10*3/uL (ref 0.0–0.5)
HCT: 39.3 % (ref 34.8–46.6)
HEMOGLOBIN: 13.4 g/dL (ref 11.6–15.9)
LYMPH%: 31.2 % (ref 14.0–49.7)
MCH: 30.3 pg (ref 25.1–34.0)
MCHC: 34.2 g/dL (ref 31.5–36.0)
MCV: 88.4 fL (ref 79.5–101.0)
MONO#: 0.6 10*3/uL (ref 0.1–0.9)
MONO%: 9.5 % (ref 0.0–14.0)
NEUT#: 3.3 10*3/uL (ref 1.5–6.5)
NEUT%: 53.4 % (ref 38.4–76.8)
Platelets: 238 10*3/uL (ref 145–400)
RBC: 4.44 10*6/uL (ref 3.70–5.45)
RDW: 13.8 % (ref 11.2–14.5)
WBC: 6.2 10*3/uL (ref 3.9–10.3)
lymph#: 1.9 10*3/uL (ref 0.9–3.3)

## 2017-01-30 LAB — COMPREHENSIVE METABOLIC PANEL
ALT: 15 U/L (ref 0–55)
AST: 16 U/L (ref 5–34)
Albumin: 3.8 g/dL (ref 3.5–5.0)
Alkaline Phosphatase: 61 U/L (ref 40–150)
Anion Gap: 6 mEq/L (ref 3–11)
BUN: 15.8 mg/dL (ref 7.0–26.0)
CHLORIDE: 104 meq/L (ref 98–109)
CO2: 30 mEq/L — ABNORMAL HIGH (ref 22–29)
CREATININE: 0.8 mg/dL (ref 0.6–1.1)
Calcium: 9.4 mg/dL (ref 8.4–10.4)
EGFR: 74 mL/min/{1.73_m2} — ABNORMAL LOW (ref >90)
Glucose: 86 mg/dl (ref 70–140)
Potassium: 3.8 mEq/L (ref 3.5–5.1)
Sodium: 140 mEq/L (ref 136–145)
Total Bilirubin: 0.54 mg/dL (ref 0.20–1.20)
Total Protein: 6.8 g/dL (ref 6.4–8.3)

## 2017-01-30 LAB — DRAW EXTRA CLOT TUBE

## 2017-01-30 NOTE — Progress Notes (Signed)
ID: Madison Alvarez OB: December 14, 1945  MR#: [id number]  IRJ#:[id number]  PCP: [name]., MD GYN:  Kimberlee Nearing SU:  OTHER MD: Jenean Lindau (PCP), Carmon Sails, Paralee Cancel   HISTORY OF PRESENT ILLNESS: From the original intent not:  Madison Alvarez noted left hip pain sometime in January of 2014. This became progressively worse, to the point that she was "limping everywhere". She brought this to the attention of her son-in-law, who is a Restaurant manager, fast food, but he was not able to relieve the problem and suggested some films. She then was evaluated by Dr. Dellis Filbert who obtained a left hip MRI and bone scan. The left hip MRI showed a nondisplaced subchondral stress fracture of the left femoral head with mild to moderate marrow edema. There was moderate chronic arthritis of both hips and incidentally sigmoid diverticulosis was also noted. As part of the evaluation lab work was obtained and this showed an M spike of 0.46 g/dL, identified as IgG lambda. Other labs obtained at that time showed a total IgG of 1180, total IgA of 171, and total IgM of 93, all normal. Hemoglobin was 13.0, MCV 87.2, creatinine 0.72 and calcium 9.4. Albumin was 4.2 and total protein 7.0. Again all these lab work was unremarkable.   Repeat labs at [facility] 01/26/2016 showed the M spike to actually have decreased to 0.3. The light chain ratio was normal with a Lambda ratio of 0.60. The urine protein creatinine ratio was normal at 101. CBC was completely normal with a white cell count of 6.3 hemoglobin 14.2 and platelets 244,000. 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 Since her last visit here she had her left hip done by Dr. Alvan Dame, the same orthopedist who did her right hip.  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  She reports no unusual pain, fever, rash, bleeding, adenopathy, unexplained weight loss or unexplained fatigue.  REVIEW OF SYSTEMS: A detailed review of systems today was otherwise stable  PAST MEDICAL HISTORY: Past Medical History:  Diagnosis Date  . 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 Blood dyscrasia    Dr. Jana Hakim follows"says nothing to be concerned about"  . Cancer [facility])    Melanoma '02- excised left hip-  . Chest pain    7-8 yrs ago -all heart tests negative for heart issues  . Hypothyroidism    She has a remote history of meningitis and carries a diagnosis of osteoporosis  PAST SURGICAL HISTORY: Past Surgical History:  Procedure Laterality Date  . CATARACT EXTRACTION, BILATERAL    . JOINT REPLACEMENT     right hip  . MELANOMA EXCISION Left    '02- left hip  . TOTAL HIP ARTHROPLASTY Right 09/01/2015   Procedure: RIGHT TOTAL HIP ARTHROPLASTY ANTERIOR APPROACH;  Surgeon: Paralee Cancel, MD;  Location: [facility];  Service: Orthopedics;  Laterality: Right;  . TOTAL HIP ARTHROPLASTY Left 08/30/2016   Procedure: LEFT TOTAL HIP ARTHROPLASTY ANTERIOR APPROACH;  Surgeon: Paralee Cancel, MD;  Location: [facility] OR[facility];  Service: Orthopedics;  Laterality: Left;  . TUBAL LIGATION     Melanoma removed from the left hip skin area approximately 2005  FAMILY HISTORY No family history on  file. The patient's father committed suicide when the patient was 71 years old. The patient's mother died from complications of epilepsy her in her late 23s. The patient has 2 brothers, one sister. There is no history of cancer in the family.  GYNECOLOGIC HISTORY:  Menarche age 89, first live birth age 79, the patient is [place] P2. She stopped having periods approximately 2006. She never took hormone replacement  SOCIAL HISTORY:  Madison Alvarez works  at "[employer]" in [place] she has 2 children from her first marriage Ronalee Belts who lives in [place] and works as a Warden/ranger, also living in [place], who works in Health and safety inspector. The patient has 3 grandchildren. [name] has 2 children from a prior marriage a daughter living in [place] and a son living in [place]. [name] has 2 grandchildren of his own. The Kains are not church attenders    ADVANCED DIRECTIVES: Not in place   HEALTH MAINTENANCE: Social History  Substance Use Topics  . Smoking status: Never Smoker  . Smokeless tobacco: Never Used  . Alcohol use Yes     Comment: social     Colonoscopy: 2013  PAP: Due  Bone density: 2012, showing "early osteoporosis"  Lipid panel:  Allergies  Allergen Reactions  . Protein Fortified Cookie [Nutritional Supplements] Hives    Protein supplements cause hives if taken while heart is elevated.   . Iodinated Diagnostic Agents Hives    1980's   . Penicillins Hives and Swelling    Has patient had a PCN reaction causing immediate rash, facial/tongue/throat swelling, SOB or lightheadedness with hypotension: Yes Has patient had a PCN reaction causing severe rash involving mucus membranes or skin necrosis: No Has patient had a PCN reaction that required hospitalization: No Has patient had a PCN reaction occurring within the last 10 years: No If all of the above answers are "NO", then may proceed with Cephalosporin use.     Current Outpatient Prescriptions  Medication Sig Dispense Refill  . aspirin 81 MG chewable tablet Chew 1 tablet (81 mg total) by mouth 2 (two) times daily. 30 tablet 0  . Bioflavonoid Products (ESTER C PO) Take 1 tablet by mouth daily.     . calcium-vitamin D (OSCAL WITH D) 500-200 MG-UNIT tablet Take 1 tablet by mouth daily with breakfast.    . celecoxib (CELEBREX) 200 MG capsule Take 1 capsule (200 mg total) by mouth daily. 30 capsule 0  . ferrous sulfate 325 (65 FE) MG tablet Take 1 tablet (325 mg  total) by mouth 2 (two) times daily with a meal. 30 tablet 0  . HYDROcodone-acetaminophen (NORCO) 7.5-325 MG tablet Take 1-2 tablets by mouth every 4 (four) hours. 60 tablet 0  . levothyroxine (SYNTHROID, LEVOTHROID) 100 MCG tablet Take 100 mcg by mouth daily before breakfast.    . methocarbamol (ROBAXIN) 500 MG tablet Take 1 tablet (500 mg total) by mouth every 6 (six) hours as needed for muscle spasms. 60 tablet 0  . Multiple Vitamin (MULTIVITAMIN WITH MINERALS) TABS tablet Take 1 tablet by mouth daily.    . sodium chloride (OCEAN) 0.65 % SOLN nasal spray Place 1 spray into both nostrils every 2 (two) hours as needed for congestion.     No current facility-administered medications for this visit.     OBJECTIVE:  Middle-aged white woman In no acute distress  Vitals:   01/30/17 1530  BP: 138/68  Pulse: (!) 55  Resp: 18  Temp: 98.7 F (37.1 C)  SpO2: 100%  Body mass index is 21.46 kg/m.    ECOG FS: 1  Sclerae unicteric, EOMs intact Oropharynx clear and moist No cervical or supraclavicular adenopathy Lungs no rales or rhonchi Heart regular rate and rhythm Abd soft, nontender, positive bowel sounds MSK no focal spinal tenderness, no upper extremity lymphedema Neuro: nonfocal, well oriented, appropriate affect Breasts: Both breasts are benign. Both axillae are benign.    LAB RESULTS: e 61.  CMP     Component Value Date/Time   NA 140 01/30/2017 1458    I No results found for: SPEP  Lab Results  Component Value Date   WBC 6.2 01/30/2017      Chemistry      Component Value Date/Time   NA 140 01/30/2017 1458      Component Value Date/Time   CALCIUM 9.4 01/30/2017 1458       No results found for: LABCA2  No components found for: [id number]  No results for input(s): INR in the last 168 hours.  Urinalysis    Component Value Date/Time   COLORURINE YELLOW 08/25/2015 0834    STUDIES: Screening yearly mammography over 2 Years behind  ASSESSMENT: 71 y.o.  [place] woman with a 0.46 g/dL serum monoclonal spike, IgG lambda, documented 10/01/2012 in the absence of urine paraproteinemia, anemia, hypercalcemia, renal injury or lytic bone leasions  (a) by repeat 01/26/2016 shows an M spike of 0.3, normal kappa/ lambda ratio  (1) significant osteoarthritis chiefly involving hips  (a) status post right total hip replacement 09/01/2015   (b) status post total left hip replacement 08/30/2016  PLAN:  Carmel [place] is now 4 years out from documentation of a monoclonal, but the with no evidence of disease progression. This is very favorable.  At this point I'm comfortable releasing her to her primary care physician. It would be prudent to obtain an SPEP yearly and if there is a significant increase in the M spike she can't be referred back.  However in most of these cases the monoclonal gammopathy either remained stable or sometimes disappears.  At this point I am not making a return appointment for Cheri here though she knows I will be glad to see her again at any point in the future if on when that became necessary  I did strongly advised her to update herself on her mammography, which continues to be behind.  Chauncey Cruel, MD   01/30/2017 6:47 PM

## 2017-01-31 LAB — KAPPA/LAMBDA LIGHT CHAINS
Ig Kappa Free Light Chain: 12.4 mg/L (ref 3.3–19.4)
Ig Lambda Free Light Chain: 23.6 mg/L (ref 5.7–26.3)
Kappa/Lambda FluidC Ratio: 0.53 (ref 0.26–1.65)

## 2017-02-06 DIAGNOSIS — E039 Hypothyroidism, unspecified: Secondary | ICD-10-CM | POA: Diagnosis not present

## 2017-02-06 DIAGNOSIS — Z6821 Body mass index (BMI) 21.0-21.9, adult: Secondary | ICD-10-CM | POA: Diagnosis not present

## 2017-02-06 DIAGNOSIS — R51 Headache: Secondary | ICD-10-CM | POA: Diagnosis not present

## 2017-02-06 DIAGNOSIS — Z1321 Encounter for screening for nutritional disorder: Secondary | ICD-10-CM | POA: Diagnosis not present

## 2017-02-06 DIAGNOSIS — Z79899 Other long term (current) drug therapy: Secondary | ICD-10-CM | POA: Diagnosis not present

## 2017-02-22 ENCOUNTER — Ambulatory Visit (INDEPENDENT_AMBULATORY_CARE_PROVIDER_SITE_OTHER): Payer: PPO | Admitting: Neurology

## 2017-02-22 DIAGNOSIS — H539 Unspecified visual disturbance: Secondary | ICD-10-CM | POA: Diagnosis not present

## 2017-02-22 DIAGNOSIS — M316 Other giant cell arteritis: Secondary | ICD-10-CM

## 2017-02-22 DIAGNOSIS — G4489 Other headache syndrome: Secondary | ICD-10-CM

## 2017-02-22 DIAGNOSIS — R519 Headache, unspecified: Secondary | ICD-10-CM

## 2017-02-22 DIAGNOSIS — R51 Headache: Secondary | ICD-10-CM | POA: Diagnosis not present

## 2017-02-22 DIAGNOSIS — G43009 Migraine without aura, not intractable, without status migrainosus: Secondary | ICD-10-CM

## 2017-02-22 MED ORDER — PREDNISONE 10 MG PO TABS: 60.0000 mg | ORAL_TABLET | Freq: Every day | ORAL | 3 refills | Status: DC

## 2017-02-22 NOTE — Progress Notes (Signed)
Madison Alvarez    Provider:  Dr Jaynee Eagles Referring Provider: [name]., MD Primary Care Physician:  [name]., MD  CC:  Persistent headaches  HPI:  Madison Alvarez is a 71 y.o. female here as a referral from Dr. Megan Salon for headache. Past medical history of hypothyroidism, hip replacement, smell and taste disturbance, insomnia, monoclonal paraproteinemia, osteoporosis. Has migraines occasionally. Continuous headache starting the beginning of August. She hs tried indomethacin.Usually over the left eye or on the top of the head, never in the back. Vision changes in the right eye blurry justin the peripheral vision lasted a few hours which she sometimes gets with occassional migraines.This is a new headache, she has not had any headaches like this or last this long or be like this with difficulty concentrating, wakes with headaches. No inciting event. Waxes and wanes. Sometimes it is dull, at its worse pressure on on the left, with vision changes. No light or sound sensitivity. Light bothered her the other day however. She has lost smell and taste. No chewing difficulty but there is fatigue, no fevers. No diplopia. Generalized fatigue. No new weakness, feels strong. No nausea.   Reviewed notes, labs and imaging from outside physicians, which showed:  Review primary care notes. This is a 71 year old female with headache. Also reports weakness. No nausea, vomiting or photophobia. Onset was 2 weeks prior to appointment on August 20. Patient described it as severe. Pain behind the left eye, crest of the head, squeezing sensation for 2 weeks worsening.  Vitamin D 25-hydroxy was 35.1, vitamin B12 683, folate normal, CBC unremarkable, CMP unremarkable with BUN 13 and creatinine 0.68.  Review of Systems: Patient complains of symptoms per HPI as well as the following symptoms: no CP, no SOB. Pertinent negatives and positives per HPI. All others negative.   Social History    Social History  . Marital status: Married    Spouse name: N/A  . Number of children: N/A  . Years of education: N/A   Occupational History  . Not on file.   Social History Main Topics  . Smoking status: Never Smoker  . Smokeless tobacco: Never Used  . Alcohol use Yes     Comment: social  . Drug use: No  . Sexual activity: Not on file   Other Topics Concern  . Not on file   Social History Narrative  . No narrative on file    No family history on file.  Past Medical History:  Diagnosis Date  . 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 Blood dyscrasia    Dr. Jana Hakim follows"says nothing to be concerned about"  . Cancer [facility])    Melanoma '02- excised left hip-  . Chest pain    7-8 yrs ago -all heart tests negative for heart issues  . Hypothyroidism     Past Surgical History:  Procedure Laterality Date  . CATARACT EXTRACTION, BILATERAL    . JOINT REPLACEMENT     right hip  . MELANOMA EXCISION Left    '02- left hip  . TOTAL HIP ARTHROPLASTY Right 09/01/2015   Procedure: RIGHT TOTAL HIP ARTHROPLASTY ANTERIOR APPROACH;  Surgeon: Paralee Cancel, MD;  Location: [facility];  Service: Orthopedics;  Laterality: Right;  . TOTAL HIP ARTHROPLASTY Left 08/30/2016   Procedure: LEFT TOTAL HIP ARTHROPLASTY ANTERIOR APPROACH;  Surgeon: Paralee Cancel, MD;  Location: [facility];  Service: Orthopedics;  Laterality: Left;  . TUBAL LIGATION  Current Outpatient Prescriptions  Medication Sig Dispense Refill  . aspirin 81 MG chewable tablet Chew 1 tablet (81 mg total) by mouth 2 (two) times daily. 30 tablet 0  . Bioflavonoid Products (ESTER C PO) Take 1 tablet by mouth daily.     . calcium-vitamin D (OSCAL WITH D) 500-200 MG-UNIT tablet Take 1 tablet by mouth daily with breakfast.    . celecoxib (CELEBREX) 200 MG capsule Take 1 capsule (200 mg total) by mouth daily. 30 capsule 0  . ferrous sulfate 325 (65 FE) MG tablet Take  1 tablet (325 mg total) by mouth 2 (two) times daily with a meal. 30 tablet 0  . HYDROcodone-acetaminophen (NORCO) 7.5-325 MG tablet Take 1-2 tablets by mouth every 4 (four) hours. 60 tablet 0  . levothyroxine (SYNTHROID, LEVOTHROID) 100 MCG tablet Take 100 mcg by mouth daily before breakfast.    . methocarbamol (ROBAXIN) 500 MG tablet Take 1 tablet (500 mg total) by mouth every 6 (six) hours as needed for muscle spasms. 60 tablet 0  . Multiple Vitamin (MULTIVITAMIN WITH MINERALS) TABS tablet Take 1 tablet by mouth daily.    . predniSONE (DELTASONE) 10 MG tablet Take 6 tablets (60 mg total) by mouth daily with breakfast. 60 tablet 3  . sodium chloride (OCEAN) 0.65 % SOLN nasal spray Place 1 spray into both nostrils every 2 (two) hours as needed for congestion.     No current facility-administered medications for this visit.     Allergies as of 02/22/2017 - Review Complete 08/30/2016  Allergen Reaction Noted  . Protein fortified cookie [nutritional supplements] Hives 09/01/2015  . Iodinated diagnostic agents Hives 08/20/2015  . Penicillins Hives and Swelling 08/20/2015    Vitals: There were no vitals taken for this visit. Last Weight:  Wt Readings from Last 1 Encounters:  01/30/17 116 lb 6.4 oz (52.8 kg)   Last Height:   Ht Readings from Last 1 Encounters:  01/30/17 5' 1.75" (1.568 m)    Physical exam: Exam: Gen: NAD, conversant, well nourised, obese, well groomed                     CV: RRR, no MRG. No Carotid Bruits. No peripheral edema, warm, nontender Eyes: Conjunctivae clear without exudates or hemorrhage  Neuro: Detailed Neurologic Exam  Speech:    Speech is normal; fluent and spontaneous with normal comprehension.  Cognition:    The patient is oriented to person, place, and time;     recent and remote memory intact;     language fluent;     normal attention, concentration,     fund of knowledge Cranial Nerves:    The pupils are equal, round, and reactive to light.  The fundi are normal and spontaneous venous pulsations are present. Visual fields are full to finger confrontation. Extraocular movements are intact. Trigeminal sensation is intact and the muscles of mastication are normal. The face is symmetric. The palate elevates in the midline. Hearing intact. Voice is normal. Shoulder shrug is normal. The tongue has normal motion without fasciculations.   Coordination:    Normal finger to nose and heel to shin. Normal rapid alternating movements.   Gait:    Heel-toe and tandem gait are normal.   Motor Observation:    No asymmetry, no atrophy, and no involuntary movements noted. Tone:    Normal muscle tone.    Posture:    Posture is normal. normal erect    Strength:    Strength is V/V in  the upper and lower limbs.      Sensation: intact to LT     Reflex Exam:  DTR's:    Absent right AJ otherwise deep tendon reflexes in the upper and lower extremities are normal bilaterally.   Toes:    The toes are downgoing bilaterally.   Clonus:    Clonus is absent.      Assessment/Plan:  71 year old with a PMHx rare migraines here for new onset headache, continuous for weeks, different that previous migraines, vision changes, positional, anosmia  - MRI brain w/wo contrast to eval for tumor especially given positional quality and anosmia - esr, crp - steroids  Orders Placed This Encounter  Procedures  . MR BRAIN W WO CONTRAST  . Sedimentation rate  . C-reactive protein  . Basic Metabolic Panel    Discussed: To prevent or relieve headaches, try the following: Cool Compress. Lie down and place a cool compress on your head.  Avoid headache triggers. If certain foods or odors seem to have triggered your migraines in the past, avoid them. A headache diary might help you identify triggers.  Include physical activity in your daily routine. Try a daily walk or other moderate aerobic exercise.  Manage stress. Find healthy ways to cope with the  stressors, such as delegating tasks on your to-do list.  Practice relaxation techniques. Try deep breathing, yoga, massage and visualization.  Eat regularly. Eating regularly scheduled meals and maintaining a healthy diet might help prevent headaches. Also, drink plenty of fluids.  Follow a regular sleep schedule. Sleep deprivation might contribute to headaches Consider biofeedback. With this mind-body technique, you learn to control certain bodily functions - such as muscle tension, heart rate and blood pressure - to prevent headaches or reduce headache pain.    Proceed to emergency room if you experience new or worsening symptoms or symptoms do not resolve, if you have new neurologic symptoms or if headache is severe, or for any concerning symptom.   Provided education and documentation from American headache Society toolbox including articles on: chronic migraine medication overuse headache, chronic migraines, prevention of migraines, behavioral and other nonpharmacologic treatments for headache.   Sarina Ill, MD  [facility] [address] [place], [place] [address]  Phone [phone] Fax [phone]

## 2017-02-22 NOTE — Patient Instructions (Addendum)
Remember to drink plenty of fluid, eat healthy meals and do not skip any meals. Try to eat protein with a every meal and eat a healthy snack such as fruit or nuts in between meals. Try to keep a regular sleep-wake schedule and try to exercise daily, particularly in the form of walking, 20-30 minutes a day, if you can.   As far as your medications are concerned, I would like to suggest: Prednisone 60mg  daily until notified  As far as diagnostic testing: MRI brain, labs  I would like to see you back in 3 months, sooner if we need to. Please call us with any interim questions, concerns, problems, updates or refill requests.   Our phone number is [phone]. We also have an after hours call service for urgent matters and there is a physician on-call for urgent questions. For any emergencies you know to call 911 or go to the nearest emergency room  Temporal Arteritis Temporal arteritis, also called giant cell arteritis, is a condition that causes arteries to become swollen (inflamed). It usually affects arteries in your head and face, but arteries in any part of the body can become inflamed. Temporal arteritis can cause serious problems, such as bone loss, diabetes, and blindness. What are the causes? The cause is unknown. What increases the risk?  Being older than 36.  Being a woman.  Being Caucasian.  Being of [place], [place], [place], [place] or [place] ancestry.  Having polymyalgia rheumatica (PMR). What are the signs or symptoms? Some people with temporal arteritis have just one symptom, while other have several symptoms. Most signs and symptoms are related to the head and face. Signs and symptoms may include:  Hard or swollen temples (common). Your temples are the flattened area on either side of your forehead. If your temples are swollen, it may hurt to touch them.  Pain when combing your hair or when laying your head down.  Pain in the jaw when chewing.  Pain in the throat  or tongue.  Problems with your vision, such as sudden loss of vision in one eye, or seeing double.  Fever.  Fatigue.  A dry cough.  Pain in the hips and shoulders.  Pain in the arms during exercise.  Depression.  Weight loss.  How is this diagnosed? Your health care provider will ask about your symptoms and do a physical exam. He or she may also perform an eye exam and tests, such as:  A complete blood count.  An erythrocyte sedimentation rate test, also called the sed rate test.  A C-reactive protein (CRP) test.  A tissue sample (biopsy) test.  How is this treated? Temporal arteritis is treated with a type of medicine called a corticosteroid. Vision problems may be treated with additional medicines. You will need to see your health care provider while you are being treated. During follow-up visits, your health care provider will check for problems by:  Performing blood tests and bone density tests.  Checking your blood pressure and blood sugar.  Follow these instructions at home:  Take medicines only as directed by your health care provider.  Take any vitamins or supplements that your health care provider suggests. These may include vitamin D and calcium, which help keep your bones from becoming weak.  Exercise. Talk with your health care provider about what exercises are okay for you to do. Usually exercises that increase your heart rate (aerobic exercise), such as walking, are recommended. Aerobic exercise helps control your blood pressure and prevent  bone loss.  Follow a healthy diet. Include healthy sources of protein, fruits, vegetables, and whole grains in your diet. Following a healthy diet helps prevent bone damage and diabetes. Contact a health care provider if:  Your symptoms get worse.  Your fever, fatigue, headache, weight loss, or pain in your jaw gets worse.  You develop signs of infection, such as fever, swelling, redness, warmth, and  tenderness. Get help right away if:  Your vision gets worse.  Your pain does not go away, even after you take pain medicine.  You have chest pain.  You have trouble breathing.  One side of your face or body suddenly becomes weak or numb. This information is not intended to replace advice given to you by your health care provider. Make sure you discuss any questions you have with your health care provider. Document Released: 04/03/2009 Document Revised: 02/04/2016 Document Reviewed: 07/31/2013 Elsevier Interactive Patient Education  2018 [employer].    Migraine Headache A migraine headache is an intense, throbbing pain on one side or both sides of the head. Migraines may also cause other symptoms, such as nausea, vomiting, and sensitivity to light and noise. What are the causes? Doing or taking certain things may also trigger migraines, such as:  Alcohol.  Smoking.  Medicines, such as: ? Medicine used to treat chest pain (nitroglycerine). ? Birth control pills. ? Estrogen pills. ? Certain blood pressure medicines.  Aged cheeses, chocolate, or caffeine.  Foods or drinks that contain nitrates, glutamate, aspartame, or tyramine.  Physical activity.  Other things that may trigger a migraine include:  Menstruation.  Pregnancy.  Hunger.  Stress, lack of sleep, too much sleep, or fatigue.  Weather changes.  What increases the risk? The following factors may make you more likely to experience migraine headaches:  Age. Risk increases with age.  Family history of migraine headaches.  Being Caucasian.  Depression and anxiety.  Obesity.  Being a woman.  Having a hole in the heart (patent foramen ovale) or other heart problems.  What are the signs or symptoms? The main symptom of this condition is pulsating or throbbing pain. Pain may:  Happen in any area of the head, such as on one side or both sides.  Interfere with daily activities.  Get worse with  physical activity.  Get worse with exposure to bright lights or loud noises.  Other symptoms may include:  Nausea.  Vomiting.  Dizziness.  General sensitivity to bright lights, loud noises, or smells.  Before you get a migraine, you may get warning signs that a migraine is developing (aura). An aura may include:  Seeing flashing lights or having blind spots.  Seeing bright spots, halos, or zigzag lines.  Having tunnel vision or blurred vision.  Having numbness or a tingling feeling.  Having trouble talking.  Having muscle weakness.  How is this diagnosed? A migraine headache can be diagnosed based on:  Your symptoms.  A physical exam.  Tests, such as CT scan or MRI of the head. These imaging tests can help rule out other causes of headaches.  Taking fluid from the spine (lumbar puncture) and analyzing it (cerebrospinal fluid analysis, or CSF analysis).  How is this treated? A migraine headache is usually treated with medicines that:  Relieve pain.  Relieve nausea.  Prevent migraines from coming back.  Treatment may also include:  Acupuncture.  Lifestyle changes like avoiding foods that trigger migraines.  Follow these instructions at home: Medicines  Take over-the-counter and prescription medicines  only as told by your health care provider.  Do not drive or use heavy machinery while taking prescription pain medicine.  To prevent or treat constipation while you are taking prescription pain medicine, your health care provider may recommend that you: ? Drink enough fluid to keep your urine clear or pale yellow. ? Take over-the-counter or prescription medicines. ? Eat foods that are high in fiber, such as fresh fruits and vegetables, whole grains, and beans. ? Limit foods that are high in fat and processed sugars, such as fried and sweet foods. Lifestyle  Avoid alcohol use.  Do not use any products that contain nicotine or tobacco, such as cigarettes  and e-cigarettes. If you need help quitting, ask your health care provider.  Get at least 8 hours of sleep every night.  Limit your stress. General instructions   Keep a journal to find out what may trigger your migraine headaches. For example, write down: ? What you eat and drink. ? How much sleep you get. ? Any change to your diet or medicines.  If you have a migraine: ? Avoid things that make your symptoms worse, such as bright lights. ? It may help to lie down in a dark, quiet room. ? Do not drive or use heavy machinery. ? Ask your health care provider what activities are safe for you while you are experiencing symptoms.  Keep all follow-up visits as told by your health care provider. This is important. Contact a health care provider if:  You develop symptoms that are different or more severe than your usual migraine symptoms. Get help right away if:  Your migraine becomes severe.  You have a fever.  You have a stiff neck.  You have vision loss.  Your muscles feel weak or like you cannot control them.  You start to lose your balance often.  You develop trouble walking.  You faint. This information is not intended to replace advice given to you by your health care provider. Make sure you discuss any questions you have with your health care provider. Document Released: 06/06/2005 Document Revised: 12/25/2015 Document Reviewed: 11/23/2015 Elsevier Interactive Patient Education  2017 [employer].   Prednisone tablets What is this medicine? PREDNISONE (PRED ni sone) is a corticosteroid. It is commonly used to treat inflammation of the skin, joints, lungs, and other organs. Common conditions treated include asthma, allergies, and arthritis. It is also used for other conditions, such as blood disorders and diseases of the adrenal glands. This medicine may be used for other purposes; ask your health care provider or pharmacist if you have questions. COMMON BRAND  NAME(S): Deltasone, Predone, Sterapred, Sterapred DS What should I tell my health care provider before I take this medicine? They need to know if you have any of these conditions: -Cushing's syndrome -diabetes -glaucoma -heart disease -high blood pressure -infection (especially a virus infection such as chickenpox, cold sores, or herpes) -kidney disease -liver disease -mental illness -myasthenia gravis -osteoporosis -seizures -stomach or intestine problems -thyroid disease -an unusual or allergic reaction to lactose, prednisone, other medicines, foods, dyes, or preservatives -pregnant or trying to get pregnant -breast-feeding How should I use this medicine? Take this medicine by mouth with a glass of water. Follow the directions on the prescription label. Take this medicine with food. If you are taking this medicine once a day, take it in the morning. Do not take more medicine than you are told to take. Do not suddenly stop taking your medicine because you may develop  a severe reaction. Your doctor will tell you how much medicine to take. If your doctor wants you to stop the medicine, the dose may be slowly lowered over time to avoid any side effects. Talk to your pediatrician regarding the use of this medicine in children. Special care may be needed. Overdosage: If you think you have taken too much of this medicine contact a poison control center or emergency room at once. NOTE: This medicine is only for you. Do not share this medicine with others. What if I miss a dose? If you miss a dose, take it as soon as you can. If it is almost time for your next dose, talk to your doctor or health care professional. You may need to miss a dose or take an extra dose. Do not take double or extra doses without advice. What may interact with this medicine? Do not take this medicine with any of the following medications: -metyrapone -mifepristone This medicine may also interact with the following  medications: -aminoglutethimide -amphotericin B -aspirin and aspirin-like medicines -barbiturates -certain medicines for diabetes, like glipizide or glyburide -cholestyramine -cholinesterase inhibitors -cyclosporine -digoxin -diuretics -ephedrine -female hormones, like estrogens and birth control pills -isoniazid -ketoconazole -NSAIDS, medicines for pain and inflammation, like ibuprofen or naproxen -phenytoin -rifampin -toxoids -vaccines -warfarin This list may not describe all possible interactions. Give your health care provider a list of all the medicines, herbs, non-prescription drugs, or dietary supplements you use. Also tell them if you smoke, drink alcohol, or use illegal drugs. Some items may interact with your medicine. What should I watch for while using this medicine? Visit your doctor or health care professional for regular checks on your progress. If you are taking this medicine over a prolonged period, carry an identification card with your name and address, the type and dose of your medicine, and your doctor's name and address. This medicine may increase your risk of getting an infection. Tell your doctor or health care professional if you are around anyone with measles or chickenpox, or if you develop sores or blisters that do not heal properly. If you are going to have surgery, tell your doctor or health care professional that you have taken this medicine within the last twelve months. Ask your doctor or health care professional about your diet. You may need to lower the amount of salt you eat. This medicine may affect blood sugar levels. If you have diabetes, check with your doctor or health care professional before you change your diet or the dose of your diabetic medicine. What side effects may I notice from receiving this medicine? Side effects that you should report to your doctor or health care professional as soon as possible: -allergic reactions like skin rash,  itching or hives, swelling of the face, lips, or tongue -changes in emotions or moods -changes in vision -depressed mood -eye pain -fever or chills, cough, sore throat, pain or difficulty passing urine -increased thirst -swelling of ankles, feet Side effects that usually do not require medical attention (report to your doctor or health care professional if they continue or are bothersome): -confusion, excitement, restlessness -headache -nausea, vomiting -skin problems, acne, thin and shiny skin -trouble sleeping -weight gain This list may not describe all possible side effects. Call your doctor for medical advice about side effects. You may report side effects to FDA at 1-800-FDA-1088. Where should I keep my medicine? Keep out of the reach of children. Store at room temperature between 15 and 30 degrees C (59 and  86 degrees F). Protect from light. Keep container tightly closed. Throw away any unused medicine after the expiration date. NOTE: This sheet is a summary. It may not cover all possible information. If you have questions about this medicine, talk to your doctor, pharmacist, or health care provider.  2018 Elsevier/Gold Standard (2011-01-20 10:57:14)

## 2017-02-23 ENCOUNTER — Telehealth: Payer: Self-pay | Admitting: *Deleted

## 2017-02-23 LAB — BASIC METABOLIC PANEL
BUN/Creatinine Ratio: 24 (ref 12–28)
BUN: 18 mg/dL (ref 8–27)
CO2: 24 mmol/L (ref 20–29)
Calcium: 9.5 mg/dL (ref 8.7–10.3)
Chloride: 103 mmol/L (ref 96–106)
Creatinine, Ser: 0.75 mg/dL (ref 0.57–1.00)
GFR calc Af Amer: 93 mL/min/{1.73_m2} (ref >59)
GFR, EST NON AFRICAN AMERICAN: 80 mL/min/{1.73_m2} (ref >59)
Glucose: 84 mg/dL (ref 65–99)
POTASSIUM: 4.2 mmol/L (ref 3.5–5.2)
SODIUM: 142 mmol/L (ref 134–144)

## 2017-02-23 LAB — C-REACTIVE PROTEIN: CRP: 0.6 mg/L (ref 0.0–4.9)

## 2017-02-23 LAB — SEDIMENTATION RATE: SED RATE: 2 mm/h (ref 0–40)

## 2017-02-23 NOTE — Telephone Encounter (Signed)
I spoke to pt and relayed that her labs were normal and she did not have temporal arteritis.  She has not started her prednisone as yet as she picked up this am.  I told her that she did not need to start this as labs normal.  She verbalized understanding.  Due to have her MRI next Tuesday.  I told her that we will call her with results when read by our neurologist (this could be 48 -72 hours).

## 2017-02-23 NOTE — Telephone Encounter (Signed)
-----   Message from Melvenia Beam, MD sent at 02/23/2017 10:12 AM EDT ----- SED rate and CRP normal, she does not have Temporal arteritis. She can taper off of the steroids every day take one less (60,50,40,30,20,10 and then stop). thanks

## 2017-02-26 DIAGNOSIS — G43009 Migraine without aura, not intractable, without status migrainosus: Secondary | ICD-10-CM | POA: Insufficient documentation

## 2017-02-28 DIAGNOSIS — R51 Headache: Secondary | ICD-10-CM | POA: Diagnosis not present

## 2017-02-28 DIAGNOSIS — R41840 Attention and concentration deficit: Secondary | ICD-10-CM | POA: Diagnosis not present

## 2017-02-28 DIAGNOSIS — H539 Unspecified visual disturbance: Secondary | ICD-10-CM | POA: Diagnosis not present

## 2017-02-28 DIAGNOSIS — H9392 Unspecified disorder of left ear: Secondary | ICD-10-CM | POA: Diagnosis not present

## 2017-03-02 ENCOUNTER — Telehealth: Payer: Self-pay | Admitting: Neurology

## 2017-03-02 NOTE — Telephone Encounter (Signed)
Pt request MRI results. Pt was told by [facility] she would get results the next day. I advised her the images have not been read, those can take up to 4 days and she will be called as soon as the provider has her readings.

## 2017-03-06 ENCOUNTER — Telehealth: Payer: Self-pay | Admitting: Neurology

## 2017-03-06 NOTE — Telephone Encounter (Signed)
Dr Jaynee Eagles- placed on desk for your review. I am happy to call the patient for you.

## 2017-03-06 NOTE — Telephone Encounter (Signed)
Madison Alvarez, unfortyunately we should speak in person about migraine management now that we know the MRI brain was normal. Have her move up her appointment to this month sometime so we can discuss all the options thanks

## 2017-03-06 NOTE — Telephone Encounter (Signed)
Called and spoke with patient. Scheduled earlier f/u for 03/20/17 at 4pm, check in 330pm. Ok per AA,MD to use this slot.  Pt verbalized understanding and appreciation for call.

## 2017-03-06 NOTE — Telephone Encounter (Signed)
See other phone note.  Duplicate.

## 2017-03-06 NOTE — Telephone Encounter (Signed)
MRI of the brain normal, thanks

## 2017-03-06 NOTE — Telephone Encounter (Signed)
Per Debra,MR, on Emma's desk.

## 2017-03-06 NOTE — Telephone Encounter (Signed)
Patient calling back regarding MRI results.

## 2017-03-06 NOTE — Telephone Encounter (Signed)
Called and spoke with patient's husband (on [place]). Relayed MRI normal per AA,MD. He verbalized understanding.   He is wanting to know what Dr Jaynee Eagles recommends she do for migraines until her 3 month follow up on 06-01-17. Advised I will send her a message and call back tomorrow at the latest to advise. He verbalized understanding and appreciation for call.

## 2017-03-06 NOTE — Telephone Encounter (Signed)
Patient calling back stating [facility] will refax MRI results.

## 2017-03-20 ENCOUNTER — Encounter: Payer: Self-pay | Admitting: Neurology

## 2017-03-20 ENCOUNTER — Ambulatory Visit (INDEPENDENT_AMBULATORY_CARE_PROVIDER_SITE_OTHER): Payer: PPO | Admitting: Neurology

## 2017-03-20 VITALS — BP 137/80 | HR 58 | Ht 61.0 in | Wt 116.0 lb

## 2017-03-20 DIAGNOSIS — I7771 Dissection of carotid artery: Secondary | ICD-10-CM | POA: Diagnosis not present

## 2017-03-20 DIAGNOSIS — I771 Stricture of artery: Secondary | ICD-10-CM | POA: Diagnosis not present

## 2017-03-20 DIAGNOSIS — I671 Cerebral aneurysm, nonruptured: Secondary | ICD-10-CM

## 2017-03-20 DIAGNOSIS — I7770 Dissection of unspecified artery: Secondary | ICD-10-CM | POA: Diagnosis not present

## 2017-03-20 DIAGNOSIS — I720 Aneurysm of carotid artery: Secondary | ICD-10-CM

## 2017-03-20 NOTE — Patient Instructions (Addendum)
Steroids:  6,6,5,5,4,4,3,3,2,2,1,1   Occipital Neuralgia Occipital neuralgia is a type of headache that causes episodes of very bad pain in the back of your head. Pain from occipital neuralgia may spread (radiate) to other parts of your head. The pain is usually brief and often goes away after you rest and relax. These headaches may be caused by irritation of the nerves that leave your spinal cord high up in your neck, just below the base of your skull (occipital nerves). Your occipital nerves transmit sensations from the back of your head, the top of your head, and the areas behind your ears. What are the causes? Occipital neuralgia can occur without any known cause (primary headache syndrome). In other cases, occipital neuralgia is caused by pressure on or irritation of one of the two occipital nerves. Causes of occipital nerve compression or irritation include:  Wear and tear of the vertebrae in the neck (osteoarthritis).  Neck injury.  Disease of the disks that separate the vertebrae.  Tumors.  Gout.  Infections.  Diabetes.  Swollen blood vessels that put pressure on the occipital nerves.  Muscle spasm in the neck.  What are the signs or symptoms? Pain is the main symptom of occipital neuralgia. It usually starts in the back of the head but may also be felt in other areas supplied by the occipital nerves. Pain is usually on one side but may be on both sides. You may have:  Brief episodes of very bad pain that is burning, stabbing, shocking, or shooting.  Pain behind the eye.  Pain triggered by neck movement or hair brushing.  Scalp tenderness.  Aching in the back of the head between episodes of very bad pain.  How is this diagnosed? Your health care provider may diagnose occipital neuralgia based on your symptoms and a physical exam. During the exam, the health care provider may push on areas supplied by the occipital nerves to see if they are painful. Some tests may  also be done to help in making the diagnosis. These may include:  Imaging studies of the upper spinal cord, such as an MRI or CT scan. These may show compression or spinal cord abnormalities.  Nerve block. You will get an injection of numbing medicine (local anesthetic) near the occipital nerve to see if this relieves pain.  How is this treated? Treatment may begin with simple measures, such as:  Rest.  Massage.  Heat.  Over-the-counter pain relievers.  If these measures do not work, you may need other treatments, including:  Medicines such as: ? Prescription-strength anti-inflammatory medicines. ? Muscle relaxants. ? Antiseizure medicines. ? Antidepressants.  Steroid injection. This involves injections of local anesthetic and strong anti-inflammatory drugs (steroids).  Pulsed radiofrequency. Wires are implanted to deliver electrical impulses that block pain signals from the occipital nerve.  Physical therapy.  Surgery to relieve nerve pressure.  Follow these instructions at home:  Take all medicines as directed by your health care provider.  Avoid activities that cause pain.  Rest when you have an attack of pain.  Try gentle massage or a heating pad to relieve pain.  Work with a physical therapist to learn stretching exercises you can do at home.  Try a different pillow or sleeping position.  Practice good posture.  Try to stay active. Get regular exercise that does not cause pain. Ask your health care provider to suggest safe exercises for you.  Keep all follow-up visits as directed by your health care provider. This is important. Contact  a health care provider if:  Your medicine is not working.  You have new or worsening symptoms. Get help right away if:  You have very bad head pain that is not going away.  You have a sudden change in vision, balance, or speech. This information is not intended to replace advice given to you by your health care  provider. Make sure you discuss any questions you have with your health care provider. Document Released: 05/31/2001 Document Revised: 11/12/2015 Document Reviewed: 05/29/2013 Elsevier Interactive Patient Education  2017 [employer].

## 2017-03-20 NOTE — Progress Notes (Signed)
[facility]    Provider:  Dr Jaynee Eagles Referring Provider: [name]., MD Primary Care Physician:  [name]., MD CC:  Persistent headaches   Interval history 03/20/2017: Patient was originally seen for continuous headache beginning of August. Usually over the left eye or on top of the head never in the back. She has a past medical history of migraines. The headaches are worsening. She has never been a complainer. She is in 6-7 pain currently. Severe pressure on the left side of the eye. Her vision is worsening in the left eye. Light, sound doesn't bother her. She doesn't have to sit still. No vomiting, no nausea, no light or sound sensitivity, no eye watering or other autonomic symptoms. Always on the left never on the right. She is teary in the office. No jaw claudication or axial pain. No snoring. No grinding of teeth. No associated neck pain or tightness. The headache waxes and wanes. No OTC medication.  Pain also in the neck area with vision changes.  Meds tried for headache: Robaxin, Reglan, Zofran,   HPI:  Madison Alvarez is a 71 y.o. female here as a referral from Dr. Megan Salon for headache. Past medical history of hypothyroidism, hip replacement, smell and taste disturbance, insomnia, monoclonal paraproteinemia, osteoporosis. Has migraines occasionally. Continuous headache starting the beginning of August. She hs tried indomethacin.Usually over the left eye or on the top of the head, never in the back. Vision changes in the right eye blurry justin the peripheral vision lasted a few hours which she sometimes gets with occassional migraines.This is a new headache, she has not had any headaches like this or last this long or be like this with difficulty concentrating, wakes with headaches. No inciting event. Waxes and wanes. Sometimes it is dull, at its worse pressure on on the left, with vision changes. No light or sound sensitivity. Light bothered her the  other day however. She has lost smell and taste. No chewing difficulty but there is fatigue, no fevers. No diplopia. Generalized fatigue. No new weakness, feels strong. No nausea.   Reviewed notes, labs and imaging from outside physicians, which showed:  Review primary care notes. This is a 71 year old female with headache. Also reports weakness. No nausea, vomiting or photophobia. Onset was 2 weeks prior to appointment on August 20. Patient described it as severe. Pain behind the left eye, crest of the head, squeezing sensation for 2 weeks worsening.  Vitamin D 25-hydroxy was 35.1, vitamin B12 683, folate normal, CBC unremarkable, CMP unremarkable with BUN 13 and creatinine 0.68.  Review of Systems: Patient complains of symptoms per HPI as well as the following symptoms: no CP, no SOB. Pertinent negatives and positives per HPI. All others negative.   Social History   Social History  . Marital status: Married    Spouse name: N/A  . Number of children: N/A  . Years of education: N/A   Occupational History  . Not on file.   Social History Main Topics  . Smoking status: Never Smoker  . Smokeless tobacco: Never Used  . Alcohol use Yes     Comment: social  . Drug use: No  . Sexual activity: Not on file   Other Topics Concern  . Not on file   Social History Narrative  . No narrative on file    No family history on file.  Past Medical History:  Diagnosis Date  . 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 Blood dyscrasia    Dr. Jana Hakim follows"says nothing to be concerned about"  . 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 Chest pain    7-8 yrs ago -all heart tests negative for heart issues  . Hypothyroidism     Past Surgical History:  Procedure Laterality Date  . CATARACT EXTRACTION, BILATERAL    . JOINT REPLACEMENT     right hip  . MELANOMA EXCISION Left    '02- left hip  . TOTAL HIP  ARTHROPLASTY Right 09/01/2015   Procedure: RIGHT TOTAL HIP ARTHROPLASTY ANTERIOR APPROACH;  Surgeon: Paralee Cancel, MD;  Location: [facility];  Service: Orthopedics;  Laterality: Right;  . TOTAL HIP ARTHROPLASTY Left 08/30/2016   Procedure: LEFT TOTAL HIP ARTHROPLASTY ANTERIOR APPROACH;  Surgeon: Paralee Cancel, MD;  Location: [facility];  Service: Orthopedics;  Laterality: Left;  . TUBAL LIGATION      Current Outpatient Prescriptions  Medication Sig Dispense Refill  . Bioflavonoid Products (ESTER C PO) Take 1 tablet by mouth daily.     . calcium-vitamin D (OSCAL WITH D) 500-200 MG-UNIT tablet Take 1 tablet by mouth daily with breakfast.    . levothyroxine (SYNTHROID, LEVOTHROID) 100 MCG tablet Take 100 mcg by mouth daily before breakfast.    . Multiple Vitamin (MULTIVITAMIN WITH MINERALS) TABS tablet Take 1 tablet by mouth daily.     No current facility-administered medications for this visit.     Allergies as of 03/20/2017 - Review Complete 03/20/2017  Allergen Reaction Noted  . Protein fortified cookie [nutritional supplements] Hives 09/01/2015  . Iodinated diagnostic agents Hives 08/20/2015  . Penicillins Hives and Swelling 08/20/2015    Vitals: BP 137/80   Pulse (!) 58   Ht '5\' 1"'$  (1.549 m)   Wt 116 lb (52.6 kg)   BMI 21.92 kg/m  Last Weight:  Wt Readings from Last 1 Encounters:  03/20/17 116 lb (52.6 kg)   Last Height:   Ht Readings from Last 1 Encounters:  03/20/17 '5\' 1"'$  (1.549 m)    Physical exam: Exam: Gen: NAD, conversant, well nourised, thin, well groomed                     CV: RRR, no MRG. No Carotid Bruits. No peripheral edema, warm, nontender Eyes: Conjunctivae clear without exudates or hemorrhage  Neuro: Detailed Neurologic Exam  Speech:    Speech is normal; fluent and spontaneous with normal comprehension.  Cognition:    The patient is oriented to person, place, and time;     recent and remote memory intact;     language fluent;     normal attention,  concentration,     fund of knowledge Cranial Nerves:    The pupils are equal, round, and reactive to light. The fundi are normal and spontaneous venous pulsations are present. Visual fields are full to finger confrontation. Extraocular movements are intact. Trigeminal sensation is intact and the muscles of mastication are normal. The face is symmetric. The palate elevates in the midline. Hearing intact. Voice is normal. Shoulder shrug is normal. The tongue has normal motion without fasciculations.   Coordination:    Normal finger to nose and heel to shin.   Motor Observation:    No asymmetry, no atrophy, and no involuntary movements noted. Tone:    Normal muscle tone.    Posture:    Posture is normal. normal erect    Strength:    Strength is V/V in the  upper and lower limbs.      Assessment/Plan:  71 year old with a PMHx rare migraines here for new left-sided continuous headache, continuous for months, different that previous migraines, vision changes left eye, tenderness to palpation left cervical artery in neck, need to eval for carotid dissection.  - MRI brain w/wo contrast was unremarkable - esr, crp were normal - steroids: Taper  - CTA of the head and neck to eval for dissection especially in the left carotid artery that could be affecting vision in the left eye.  To prevent or relieve headaches, try the following: Cool Compress. Lie down and place a cool compress on your head.  Avoid headache triggers. If certain foods or odors seem to have triggered your migraines in the past, avoid them. A headache diary might help you identify triggers.  Include physical activity in your daily routine. Try a daily walk or other moderate aerobic exercise.  Manage stress. Find healthy ways to cope with the stressors, such as delegating tasks on your to-do list.  Practice relaxation techniques. Try deep breathing, yoga, massage and visualization.  Eat regularly. Eating regularly scheduled  meals and maintaining a healthy diet might help prevent headaches. Also, drink plenty of fluids.  Follow a regular sleep schedule. Sleep deprivation might contribute to headaches Consider biofeedback. With this mind-body technique, you learn to control certain bodily functions - such as muscle tension, heart rate and blood pressure - to prevent headaches or reduce headache pain.    Proceed to emergency room if you experience new or worsening symptoms or symptoms do not resolve, if you have new neurologic symptoms or if headache is severe, or for any concerning symptom.   Provided education and documentation from American headache Society toolbox including articles on: chronic migraine medication overuse headache, chronic migraines, prevention of migraines, behavioral and other nonpharmacologic treatments for headache.:  Orders Placed This Encounter  Procedures  . CT ANGIO HEAD W OR WO CONTRAST  . CT ANGIO NECK W OR WO CONTRAST   Sarina Ill, MD  [facility] [address] [place], [place] [address]  Phone [phone] Fax [phone]  A total of 55 minutes was spent face-to-face with this patient. Over half this time was spent on counseling patient on the temporal headache  diagnosis and different diagnostic and therapeutic options available.

## 2017-03-21 ENCOUNTER — Telehealth: Payer: Self-pay

## 2017-03-21 NOTE — Telephone Encounter (Signed)
I called pt, per Dr. Jaynee Eagles request, to offer her a migraine infusion, but pt declined the infusion, stating she has too many things going on right now to come in. Pt feels that she can handle her migraine, she will nap and take prednisone, and respond to Dr. Tamela Oddi message.

## 2017-03-23 ENCOUNTER — Telehealth: Payer: Self-pay

## 2017-03-23 ENCOUNTER — Telehealth: Payer: Self-pay | Admitting: Neurology

## 2017-03-23 NOTE — Telephone Encounter (Signed)
Spoke with patient's husband regarding 13-hour prep for CT 03/27/17 @ 1500.  He understands need for prep regardless of Dr. Jaynee Eagles having patient on a prednisone taper.  He knows the Rx was called in to their [facility] in [place] and to call me with any questions.  Doses are: Prednisone 50mg  PO 03/27/17 @ 0200, 0800 and 1400.  Benadryl 50mg  PO @ 1400, too.  Brita Romp, RN

## 2017-03-23 NOTE — Telephone Encounter (Signed)
Since you okay the 13 hour oral prep for the CT  I called Latisha at [facility] and informed her of this and she states she will let one of their nurse's know to call it in to her pharmacy.

## 2017-03-27 ENCOUNTER — Ambulatory Visit
Admission: RE | Admit: 2017-03-27 | Discharge: 2017-03-27 | Disposition: A | Discharge status: Home / Self Care | Payer: PPO | Source: Ambulatory Visit | Attending: Neurology | Admitting: Neurology

## 2017-03-27 DIAGNOSIS — I7770 Dissection of unspecified artery: Secondary | ICD-10-CM

## 2017-03-27 DIAGNOSIS — I771 Stricture of artery: Secondary | ICD-10-CM

## 2017-03-27 DIAGNOSIS — I720 Aneurysm of carotid artery: Secondary | ICD-10-CM

## 2017-03-27 DIAGNOSIS — I7771 Dissection of carotid artery: Secondary | ICD-10-CM

## 2017-03-27 DIAGNOSIS — R51 Headache: Secondary | ICD-10-CM | POA: Diagnosis not present

## 2017-03-27 DIAGNOSIS — I671 Cerebral aneurysm, nonruptured: Secondary | ICD-10-CM

## 2017-03-27 DIAGNOSIS — M542 Cervicalgia: Secondary | ICD-10-CM | POA: Diagnosis not present

## 2017-03-27 MED ORDER — IOPAMIDOL (ISOVUE-370) INJECTION 76%
90.0000 mL | Freq: Once | INTRAVENOUS | Status: AC | PRN
  Administered 2017-03-27: 90 mL via INTRAVENOUS

## 2017-03-28 ENCOUNTER — Telehealth: Payer: Self-pay | Admitting: *Deleted

## 2017-03-28 NOTE — Telephone Encounter (Signed)
-----   Message from Melvenia Beam, MD sent at 03/27/2017  5:48 PM EDT ----- Imaging is normal. Nothing to cause her headaches. If patient would like to try migraine preventative medication have her email me. thanks

## 2017-03-28 NOTE — Telephone Encounter (Signed)
Called patient and made her aware of normal imaging. She will email Dr. Jaynee Eagles if she wants to try preventative migraine medication.

## 2017-04-14 DIAGNOSIS — Z6822 Body mass index (BMI) 22.0-22.9, adult: Secondary | ICD-10-CM | POA: Diagnosis not present

## 2017-04-14 DIAGNOSIS — J069 Acute upper respiratory infection, unspecified: Secondary | ICD-10-CM | POA: Diagnosis not present

## 2017-04-14 DIAGNOSIS — E039 Hypothyroidism, unspecified: Secondary | ICD-10-CM | POA: Diagnosis not present

## 2017-05-30 DIAGNOSIS — T148XXA Other injury of unspecified body region, initial encounter: Secondary | ICD-10-CM | POA: Diagnosis not present

## 2017-05-30 DIAGNOSIS — S3993XA Unspecified injury of pelvis, initial encounter: Secondary | ICD-10-CM | POA: Diagnosis not present

## 2017-05-30 DIAGNOSIS — S0003XA Contusion of scalp, initial encounter: Secondary | ICD-10-CM | POA: Diagnosis not present

## 2017-05-30 DIAGNOSIS — S3992XA Unspecified injury of lower back, initial encounter: Secondary | ICD-10-CM | POA: Diagnosis not present

## 2017-05-30 DIAGNOSIS — E039 Hypothyroidism, unspecified: Secondary | ICD-10-CM | POA: Diagnosis not present

## 2017-05-30 DIAGNOSIS — R51 Headache: Secondary | ICD-10-CM | POA: Diagnosis not present

## 2017-05-30 DIAGNOSIS — R102 Pelvic and perineal pain: Secondary | ICD-10-CM | POA: Diagnosis not present

## 2017-05-30 DIAGNOSIS — Z79899 Other long term (current) drug therapy: Secondary | ICD-10-CM | POA: Diagnosis not present

## 2017-05-30 DIAGNOSIS — M5136 Other intervertebral disc degeneration, lumbar region: Secondary | ICD-10-CM | POA: Diagnosis not present

## 2017-05-30 DIAGNOSIS — M545 Low back pain: Secondary | ICD-10-CM | POA: Diagnosis not present

## 2017-05-30 DIAGNOSIS — S60511A Abrasion of right hand, initial encounter: Secondary | ICD-10-CM | POA: Diagnosis not present

## 2017-05-31 DIAGNOSIS — M7552 Bursitis of left shoulder: Secondary | ICD-10-CM | POA: Diagnosis not present

## 2017-05-31 DIAGNOSIS — M75102 Unspecified rotator cuff tear or rupture of left shoulder, not specified as traumatic: Secondary | ICD-10-CM | POA: Diagnosis not present

## 2017-06-01 ENCOUNTER — Ambulatory Visit: Payer: Self-pay | Admitting: Neurology

## 2017-06-20 HISTORY — PX: SHOULDER SURGERY: SHX246

## 2017-06-22 DIAGNOSIS — S46912A Strain of unspecified muscle, fascia and tendon at shoulder and upper arm level, left arm, initial encounter: Secondary | ICD-10-CM | POA: Diagnosis not present

## 2017-06-22 DIAGNOSIS — M9904 Segmental and somatic dysfunction of sacral region: Secondary | ICD-10-CM | POA: Diagnosis not present

## 2017-06-22 DIAGNOSIS — M5442 Lumbago with sciatica, left side: Secondary | ICD-10-CM | POA: Diagnosis not present

## 2017-06-22 DIAGNOSIS — M9902 Segmental and somatic dysfunction of thoracic region: Secondary | ICD-10-CM | POA: Diagnosis not present

## 2017-06-22 DIAGNOSIS — M5136 Other intervertebral disc degeneration, lumbar region: Secondary | ICD-10-CM | POA: Diagnosis not present

## 2017-06-22 DIAGNOSIS — M25612 Stiffness of left shoulder, not elsewhere classified: Secondary | ICD-10-CM | POA: Diagnosis not present

## 2017-06-22 DIAGNOSIS — M546 Pain in thoracic spine: Secondary | ICD-10-CM | POA: Diagnosis not present

## 2017-06-22 DIAGNOSIS — M9903 Segmental and somatic dysfunction of lumbar region: Secondary | ICD-10-CM | POA: Diagnosis not present

## 2017-07-20 DIAGNOSIS — M25612 Stiffness of left shoulder, not elsewhere classified: Secondary | ICD-10-CM | POA: Diagnosis not present

## 2017-07-20 DIAGNOSIS — S46912A Strain of unspecified muscle, fascia and tendon at shoulder and upper arm level, left arm, initial encounter: Secondary | ICD-10-CM | POA: Diagnosis not present

## 2017-07-20 DIAGNOSIS — M9904 Segmental and somatic dysfunction of sacral region: Secondary | ICD-10-CM | POA: Diagnosis not present

## 2017-07-20 DIAGNOSIS — M5136 Other intervertebral disc degeneration, lumbar region: Secondary | ICD-10-CM | POA: Diagnosis not present

## 2017-07-20 DIAGNOSIS — M9902 Segmental and somatic dysfunction of thoracic region: Secondary | ICD-10-CM | POA: Diagnosis not present

## 2017-07-20 DIAGNOSIS — M546 Pain in thoracic spine: Secondary | ICD-10-CM | POA: Diagnosis not present

## 2017-07-20 DIAGNOSIS — M9903 Segmental and somatic dysfunction of lumbar region: Secondary | ICD-10-CM | POA: Diagnosis not present

## 2017-07-20 DIAGNOSIS — M5442 Lumbago with sciatica, left side: Secondary | ICD-10-CM | POA: Diagnosis not present

## 2017-07-26 ENCOUNTER — Ambulatory Visit: Payer: Self-pay | Admitting: Neurology

## 2017-08-07 DIAGNOSIS — H35372 Puckering of macula, left eye: Secondary | ICD-10-CM | POA: Diagnosis not present

## 2017-08-16 DIAGNOSIS — M25512 Pain in left shoulder: Secondary | ICD-10-CM | POA: Diagnosis not present

## 2017-08-16 DIAGNOSIS — M7532 Calcific tendinitis of left shoulder: Secondary | ICD-10-CM | POA: Diagnosis not present

## 2017-08-17 DIAGNOSIS — H35372 Puckering of macula, left eye: Secondary | ICD-10-CM | POA: Diagnosis not present

## 2017-08-23 NOTE — Progress Notes (Signed)
This encounter was created in error - please disregard.

## 2017-08-30 ENCOUNTER — Encounter: Payer: Self-pay | Admitting: Neurology

## 2017-08-30 ENCOUNTER — Ambulatory Visit (INDEPENDENT_AMBULATORY_CARE_PROVIDER_SITE_OTHER): Payer: PPO | Admitting: Neurology

## 2017-08-30 VITALS — BP 126/73 | HR 56 | Ht 61.75 in | Wt 118.6 lb

## 2017-08-30 DIAGNOSIS — M7918 Myalgia, other site: Secondary | ICD-10-CM | POA: Diagnosis not present

## 2017-08-30 MED ORDER — TRAMADOL HCL 50 MG PO TABS: ORAL_TABLET | ORAL | 0 refills | Status: DC

## 2017-08-30 NOTE — Progress Notes (Signed)
[facility]    Provider:  Dr Jaynee Eagles Referring Provider: [name]., MD Primary Care Physician:  [name]., MD CC:  Persistent headaches  Interval history 08/30/2017: She is here for follow up. She saw a chiropractor and was feeling better. In December she fell and injured her rotator cuff. The headaches worsened. She can hardly move her left shoulder. The headaches are worsening with neck pain and shoulder injury. The pain radiates down the arm and to the thimb. The left arm is painful. Her left trap is very tight. Discussed PT and dry needling. She has had shoulder problems for years. Unclear if migraine is secindary to neck pain or vice versa with concomitant rotator cuff irritation.   CTA head and neck: reviewed images and agree, reviewed with patient and husband 1. No explanation for neck pain and headache. Negative for left carotid dissection. 2. Mild intracranial and cervical atherosclerosis without flow limiting stenosis or ulceration.  Interval history 03/20/2017: Patient was originally seen for continuous headache beginning of August. Usually over the left eye or on top of the head never in the back. She has a past medical history of migraines. The headaches are worsening. She has never been a complainer. She is in 6-7 pain currently. Severe pressure on the left side of the eye. Her vision is worsening in the left eye. Light, sound doesn't bother her. She doesn't have to sit still. No vomiting, no nausea, no light or sound sensitivity, no eye watering or other autonomic symptoms. Always on the left never on the right. She is teary in the office. No jaw claudication or axial pain. No snoring. No grinding of teeth. No associated neck pain or tightness. The headache waxes and wanes. No OTC medication.  Pain also in the neck area with vision changes.  Meds tried for headache: Robaxin, Reglan, Zofran,   HPI:  Madison Alvarez is a 72 y.o. female here as  a referral from Dr. Megan Salon for headache. Past medical history of hypothyroidism, hip replacement, smell and taste disturbance, insomnia, monoclonal paraproteinemia, osteoporosis. Has migraines occasionally. Continuous headache starting the beginning of August. She hs tried indomethacin.Usually over the left eye or on the top of the head, never in the back. Vision changes in the right eye blurry justin the peripheral vision lasted a few hours which she sometimes gets with occassional migraines.This is a new headache, she has not had any headaches like this or last this long or be like this with difficulty concentrating, wakes with headaches. No inciting event. Waxes and wanes. Sometimes it is dull, at its worse pressure on on the left, with vision changes. No light or sound sensitivity. Light bothered her the other day however. She has lost smell and taste. No chewing difficulty but there is fatigue, no fevers. No diplopia. Generalized fatigue. No new weakness, feels strong. No nausea.   Reviewed notes, labs and imaging from outside physicians, which showed:  Review primary care notes. This is a 72 year old female with headache. Also reports weakness. No nausea, vomiting or photophobia. Onset was 2 weeks prior to appointment on August 20. Patient described it as severe. Pain behind the left eye, crest of the head, squeezing sensation for 2 weeks worsening.  Vitamin D 25-hydroxy was 35.1, vitamin B12 683, folate normal, CBC unremarkable, CMP unremarkable with BUN 13 and creatinine 0.68.  Review of Systems: Patient complains of symptoms per HPI as well as the following symptoms: no CP, no SOB, cervical neckpain. Pertinent negatives and  positives per HPI. All others negative.   Social History   Socioeconomic History  . Marital status: Married    Spouse name: Not on file  . Number of children: Not on file  . Years of education: Not on file  . Highest education level: Not on file  Social Needs    . Financial resource strain: Not on file  . Food insecurity - worry: Not on file  . Food insecurity - inability: Not on file  . Transportation needs - medical: Not on file  . Transportation needs - non-medical: Not on file  Occupational History  . Not on file  Tobacco Use  . Smoking status: Never Smoker  . Smokeless tobacco: Never Used  Substance and Sexual Activity  . Alcohol use: Yes    Comment: social  . Drug use: No  . Sexual activity: Not on file  Other Topics Concern  . Not on file  Social History Narrative  . Not on file    No family history on file.  Past Medical History:  Diagnosis Date  . 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 Blood dyscrasia    Dr. Jana Hakim follows"says nothing to be concerned about"  . Cancer [facility])    Melanoma '02- excised left hip-  . Chest pain    7-8 yrs ago -all heart tests negative for heart issues  . Hypothyroidism     Past Surgical History:  Procedure Laterality Date  . CATARACT EXTRACTION, BILATERAL    . JOINT REPLACEMENT     right hip  . MELANOMA EXCISION Left    '02- left hip  . TOTAL HIP ARTHROPLASTY Right 09/01/2015   Procedure: RIGHT TOTAL HIP ARTHROPLASTY ANTERIOR APPROACH;  Surgeon: Paralee Cancel, MD;  Location: [facility];  Service: Orthopedics;  Laterality: Right;  . TOTAL HIP ARTHROPLASTY Left 08/30/2016   Procedure: LEFT TOTAL HIP ARTHROPLASTY ANTERIOR APPROACH;  Surgeon: Paralee Cancel, MD;  Location: [facility];  Service: Orthopedics;  Laterality: Left;  . TUBAL LIGATION      Current Outpatient Medications  Medication Sig Dispense Refill  . Bioflavonoid Products (ESTER C PO) Take 1 tablet by mouth daily.     . calcium-vitamin D (OSCAL WITH D) 500-200 MG-UNIT tablet Take 1 tablet by mouth daily with breakfast.    . levothyroxine (SYNTHROID, LEVOTHROID) 100 MCG tablet Take 100 mcg by mouth daily before breakfast.    . Multiple Vitamin (MULTIVITAMIN WITH  MINERALS) TABS tablet Take 1 tablet by mouth daily.     No current facility-administered medications for this visit.     Allergies as of 08/30/2017 - Review Complete 03/23/2017  Allergen Reaction Noted  . Protein fortified cookie [nutritional supplements] Hives 09/01/2015  . Penicillins Hives and Swelling 08/20/2015  . Iodinated diagnostic agents Hives 08/20/2015    Vitals: There were no vitals taken for this visit. Last Weight:  Wt Readings from Last 1 Encounters:  03/20/17 116 lb (52.6 kg)   Last Height:   Ht Readings from Last 1 Encounters:  03/20/17 5' 1"  (1.549 m)    Physical exam: Exam: Gen: NAD, conversant, well nourised, thin, well groomed                     CV: RRR, no MRG. No Carotid Bruits. No peripheral edema, warm, nontender Eyes: Conjunctivae clear without exudates or hemorrhage  Neuro: Detailed Neurologic Exam  Speech:    Speech is normal; fluent and spontaneous with normal  comprehension.  Cognition:    The patient is oriented to person, place, and time;     recent and remote memory intact;     language fluent;     normal attention, concentration,     fund of knowledge Cranial Nerves:    The pupils are equal, round, and reactive to light. The fundi are normal and spontaneous venous pulsations are present. Visual fields are full to finger confrontation. Extraocular movements are intact. Trigeminal sensation is intact and the muscles of mastication are normal. The face is symmetric. The palate elevates in the midline. Hearing intact. Voice is normal. Shoulder shrug is normal. The tongue has normal motion without fasciculations.   Coordination:    Normal finger to nose and heel to shin.   Motor Observation:    No asymmetry, no atrophy, and no involuntary movements noted. Tone:    Normal muscle tone.    Posture:    Posture is normal. normal erect    Strength:    Strength is V/V in the upper and lower limbs.      Assessment/Plan:  72 year old with  a PMHx rare migraines here for new left-sided continuous headache, continuous for months, different that previous migraines, vision changes left eye, tenderness to palpation left cervical artery in neck, need to eval for carotid dissection.  - MRI brain w/wo contrast was unremarkable - esr, crp were normal - steroids: Taper  - CTA of the head and neck to eval for dissection especially in the left carotid artery that could be affecting vision in the left eye: was normal - Discussed dry needling, she has left sided cervical muscle pain, likely causing headache/cervicalgia and triggering migraines. Asked he to discuss with PT after her surgery on rotator cuff. - return to clinic if symptoms persist after surgery    To prevent or relieve headaches, try the following: Cool Compress. Lie down and place a cool compress on your head.  Avoid headache triggers. If certain foods or odors seem to have triggered your migraines in the past, avoid them. A headache diary might help you identify triggers.  Include physical activity in your daily routine. Try a daily walk or other moderate aerobic exercise.  Manage stress. Find healthy ways to cope with the stressors, such as delegating tasks on your to-do list.  Practice relaxation techniques. Try deep breathing, yoga, massage and visualization.  Eat regularly. Eating regularly scheduled meals and maintaining a healthy diet might help prevent headaches. Also, drink plenty of fluids.  Follow a regular sleep schedule. Sleep deprivation might contribute to headaches Consider biofeedback. With this mind-body technique, you learn to control certain bodily functions - such as muscle tension, heart rate and blood pressure - to prevent headaches or reduce headache pain.    Proceed to emergency room if you experience new or worsening symptoms or symptoms do not resolve, if you have new neurologic symptoms or if headache is severe, or for any concerning symptom.    Provided education and documentation from American headache Society toolbox including articles on: chronic migraine medication overuse headache, chronic migraines, prevention of migraines, behavioral and other nonpharmacologic treatments for headache.:   Sarina Ill, MD  [facility] [address] [place], [place] [address]  Phone [phone] Fax [phone]  A total of 25 minutes was spent face-to-face with this patient. Over half this time was spent on counseling patient on the headache, cervocalgia, cervical myofascial pain  diagnosis and different diagnostic and therapeutic options available.

## 2017-08-30 NOTE — Progress Notes (Signed)
[facility]    Provider:  Dr Jaynee Eagles Referring Provider: [name]., MD Primary Care Physician:  [name]., MD CC:  Persistent headaches  Interval history 08/30/2017:    CTA head and neck: reviewed images and agree 1. No explanation for neck pain and headache. Negative for left carotid dissection. 2. Mild intracranial and cervical atherosclerosis without flow limiting stenosis or ulceration.  Interval history 03/20/2017: Patient was originally seen for continuous headache beginning of August. Usually over the left eye or on top of the head never in the back. She has a past medical history of migraines. The headaches are worsening. She has never been a complainer. She is in 6-7 pain currently. Severe pressure on the left side of the eye. Her vision is worsening in the left eye. Light, sound doesn't bother her. She doesn't have to sit still. No vomiting, no nausea, no light or sound sensitivity, no eye watering or other autonomic symptoms. Always on the left never on the right. She is teary in the office. No jaw claudication or axial pain. No snoring. No grinding of teeth. No associated neck pain or tightness. The headache waxes and wanes. No OTC medication.  Pain also in the neck area with vision changes.  Meds tried for headache: Robaxin, Reglan, Zofran,   HPI:  Madison Alvarez is a 72 y.o. female here as a referral from Dr. Megan Salon for headache. Past medical history of hypothyroidism, hip replacement, smell and taste disturbance, insomnia, monoclonal paraproteinemia, osteoporosis. Has migraines occasionally. Continuous headache starting the beginning of August. She hs tried indomethacin.Usually over the left eye or on the top of the head, never in the back. Vision changes in the right eye blurry justin the peripheral vision lasted a few hours which she sometimes gets with occassional migraines.This is a new headache, she has not had any headaches like this  or last this long or be like this with difficulty concentrating, wakes with headaches. No inciting event. Waxes and wanes. Sometimes it is dull, at its worse pressure on on the left, with vision changes. No light or sound sensitivity. Light bothered her the other day however. She has lost smell and taste. No chewing difficulty but there is fatigue, no fevers. No diplopia. Generalized fatigue. No new weakness, feels strong. No nausea.   Reviewed notes, labs and imaging from outside physicians, which showed:  Review primary care notes. This is a 72 year old female with headache. Also reports weakness. No nausea, vomiting or photophobia. Onset was 2 weeks prior to appointment on August 20. Patient described it as severe. Pain behind the left eye, crest of the head, squeezing sensation for 2 weeks worsening.  Vitamin D 25-hydroxy was 35.1, vitamin B12 683, folate normal, CBC unremarkable, CMP unremarkable with BUN 13 and creatinine 0.68.  Review of Systems: Patient complains of symptoms per HPI as well as the following symptoms: no CP, no SOB. Pertinent negatives and positives per HPI. All others negative.   Social History   Socioeconomic History  . Marital status: Married    Spouse name: Not on file  . Number of children: Not on file  . Years of education: Not on file  . Highest education level: Not on file  Social Needs  . Financial resource strain: Not on file  . Food insecurity - worry: Not on file  . Food insecurity - inability: Not on file  . Transportation needs - medical: Not on file  . Transportation needs - non-medical: Not on file  Occupational History  . Not on file  Tobacco Use  . Smoking status: Never Smoker  . Smokeless tobacco: Never Used  Substance and Sexual Activity  . Alcohol use: Yes    Comment: social  . Drug use: No  . Sexual activity: Not on file  Other Topics Concern  . Not on file  Social History Narrative  . Not on file    No family history on  file.  Past Medical History:  Diagnosis Date  . 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 Blood dyscrasia    Dr. Jana Hakim follows"says nothing to be concerned about"  . Cancer [facility])    Melanoma '02- excised left hip-  . Chest pain    7-8 yrs ago -all heart tests negative for heart issues  . Hypothyroidism     Past Surgical History:  Procedure Laterality Date  . CATARACT EXTRACTION, BILATERAL    . JOINT REPLACEMENT     right hip  . MELANOMA EXCISION Left    '02- left hip  . TOTAL HIP ARTHROPLASTY Right 09/01/2015   Procedure: RIGHT TOTAL HIP ARTHROPLASTY ANTERIOR APPROACH;  Surgeon: Paralee Cancel, MD;  Location: [facility];  Service: Orthopedics;  Laterality: Right;  . TOTAL HIP ARTHROPLASTY Left 08/30/2016   Procedure: LEFT TOTAL HIP ARTHROPLASTY ANTERIOR APPROACH;  Surgeon: Paralee Cancel, MD;  Location: [facility];  Service: Orthopedics;  Laterality: Left;  . TUBAL LIGATION      Current Outpatient Medications  Medication Sig Dispense Refill  . Bioflavonoid Products (ESTER C PO) Take 1 tablet by mouth daily.     . calcium-vitamin D (OSCAL WITH D) 500-200 MG-UNIT tablet Take 1 tablet by mouth daily with breakfast.    . levothyroxine (SYNTHROID, LEVOTHROID) 100 MCG tablet Take 100 mcg by mouth daily before breakfast.    . Multiple Vitamin (MULTIVITAMIN WITH MINERALS) TABS tablet Take 1 tablet by mouth daily.     No current facility-administered medications for this visit.     Allergies as of 08/30/2017 - Review Complete 03/23/2017  Allergen Reaction Noted  . Protein fortified cookie [nutritional supplements] Hives 09/01/2015  . Penicillins Hives and Swelling 08/20/2015  . Iodinated diagnostic agents Hives 08/20/2015    Vitals: There were no vitals taken for this visit. Last Weight:  Wt Readings from Last 1 Encounters:  03/20/17 116 lb (52.6 kg)   Last Height:   Ht Readings from Last 1 Encounters:  03/20/17 5'  1" (1.549 m)    Physical exam: Exam: Gen: NAD, conversant, well nourised, thin, well groomed                     CV: RRR, no MRG. No Carotid Bruits. No peripheral edema, warm, nontender Eyes: Conjunctivae clear without exudates or hemorrhage  Neuro: Detailed Neurologic Exam  Speech:    Speech is normal; fluent and spontaneous with normal comprehension.  Cognition:    The patient is oriented to person, place, and time;     recent and remote memory intact;     language fluent;     normal attention, concentration,     fund of knowledge Cranial Nerves:    The pupils are equal, round, and reactive to light. The fundi are normal and spontaneous venous pulsations are present. Visual fields are full to finger confrontation. Extraocular movements are intact. Trigeminal sensation is intact and the muscles of mastication are normal. The face is symmetric. The palate elevates in the midline.  Hearing intact. Voice is normal. Shoulder shrug is normal. The tongue has normal motion without fasciculations.   Coordination:    Normal finger to nose and heel to shin.   Motor Observation:    No asymmetry, no atrophy, and no involuntary movements noted. Tone:    Normal muscle tone.    Posture:    Posture is normal. normal erect    Strength:    Strength is V/V in the upper and lower limbs.      Assessment/Plan:  72 year old with a PMHx rare migraines here for new left-sided continuous headache, continuous for months, different that previous migraines, vision changes left eye, tenderness to palpation left cervical artery in neck, need to eval for carotid dissection.  - MRI brain w/wo contrast was unremarkable - esr, crp were normal - steroids: Taper  - CTA of the head and neck to eval for dissection especially in the left carotid artery that could be affecting vision in the left eye.  To prevent or relieve headaches, try the following: Cool Compress. Lie down and place a cool compress on your  head.  Avoid headache triggers. If certain foods or odors seem to have triggered your migraines in the past, avoid them. A headache diary might help you identify triggers.  Include physical activity in your daily routine. Try a daily walk or other moderate aerobic exercise.  Manage stress. Find healthy ways to cope with the stressors, such as delegating tasks on your to-do list.  Practice relaxation techniques. Try deep breathing, yoga, massage and visualization.  Eat regularly. Eating regularly scheduled meals and maintaining a healthy diet might help prevent headaches. Also, drink plenty of fluids.  Follow a regular sleep schedule. Sleep deprivation might contribute to headaches Consider biofeedback. With this mind-body technique, you learn to control certain bodily functions - such as muscle tension, heart rate and blood pressure - to prevent headaches or reduce headache pain.    Proceed to emergency room if you experience new or worsening symptoms or symptoms do not resolve, if you have new neurologic symptoms or if headache is severe, or for any concerning symptom.   Provided education and documentation from American headache Society toolbox including articles on: chronic migraine medication overuse headache, chronic migraines, prevention of migraines, behavioral and other nonpharmacologic treatments for headache.:  No orders of the defined types were placed in this encounter.  Sarina Ill, MD  [facility] [address] [place], [place] [address]  Phone [phone] Fax [phone]  A total of 55 minutes was spent face-to-face with this patient. Over half this time was spent on counseling patient on the temporal headache  diagnosis and different diagnostic and therapeutic options available.

## 2017-08-30 NOTE — Patient Instructions (Signed)
Tramadol tablets What is this medicine? TRAMADOL (TRA ma dole) is a pain reliever. It is used to treat moderate to severe pain in adults. This medicine may be used for other purposes; ask your health care provider or pharmacist if you have questions. COMMON BRAND NAME(S): Ultram What should I tell my health care provider before I take this medicine? They need to know if you have any of these conditions: -brain tumor -depression -drug abuse or addiction -head injury -if you frequently drink alcohol containing drinks -kidney disease or trouble passing urine -liver disease -lung disease, asthma, or breathing problems -seizures or epilepsy -suicidal thoughts, plans, or attempt; a previous suicide attempt by you or a family member -an unusual or allergic reaction to tramadol, codeine, other medicines, foods, dyes, or preservatives -pregnant or trying to get pregnant -breast-feeding How should I use this medicine? Take this medicine by mouth with a full glass of water. Follow the directions on the prescription label. You can take it with or without food. If it upsets your stomach, take it with food. Do not take your medicine more often than directed. A special MedGuide will be given to you by the pharmacist with each prescription and refill. Be sure to read this information carefully each time. Talk to your pediatrician regarding the use of this medicine in children. Special care may be needed. Overdosage: If you think you have taken too much of this medicine contact a poison control center or emergency room at once. NOTE: This medicine is only for you. Do not share this medicine with others. What if I miss a dose? If you miss a dose, take it as soon as you can. If it is almost time for your next dose, take only that dose. Do not take double or extra doses. What may interact with this medicine? Do not take this medication with any of the following medicines: -MAOIs like Carbex, Eldepryl,  Marplan, Nardil, and Parnate This medicine may also interact with the following medications: -alcohol -antihistamines for allergy, cough and cold -certain medicines for anxiety or sleep -certain medicines for depression like amitriptyline, fluoxetine, sertraline -certain medicines for migraine headache like almotriptan, eletriptan, frovatriptan, naratriptan, rizatriptan, sumatriptan, zolmitriptan -certain medicines for seizures like carbamazepine, oxcarbazepine, phenobarbital, primidone -certain medicines that treat or prevent blood clots like warfarin -digoxin -furazolidone -general anesthetics like halothane, isoflurane, methoxyflurane, propofol -linezolid -local anesthetics like lidocaine, pramoxine, tetracaine -medicines that relax muscles for surgery -other narcotic medicines for pain or cough -phenothiazines like chlorpromazine, mesoridazine, prochlorperazine, thioridazine -procarbazine This list may not describe all possible interactions. Give your health care provider a list of all the medicines, herbs, non-prescription drugs, or dietary supplements you use. Also tell them if you smoke, drink alcohol, or use illegal drugs. Some items may interact with your medicine. What should I watch for while using this medicine? Tell your doctor or health care professional if your pain does not go away, if it gets worse, or if you have new or a different type of pain. You may develop tolerance to the medicine. Tolerance means that you will need a higher dose of the medicine for pain relief. Tolerance is normal and is expected if you take this medicine for a long time. Do not suddenly stop taking your medicine because you may develop a severe reaction. Your body becomes used to the medicine. This does NOT mean you are addicted. Addiction is a behavior related to getting and using a drug for a non-medical reason. If you have pain, you  have a medical reason to take pain medicine. Your doctor will tell  you how much medicine to take. If your doctor wants you to stop the medicine, the dose will be slowly lowered over time to avoid any side effects. There are different types of narcotic medicines (opiates). If you take more than one type at the same time or if you are taking another medicine that also causes drowsiness, you may have more side effects. Give your health care provider a list of all medicines you use. Your doctor will tell you how much medicine to take. Do not take more medicine than directed. Call emergency for help if you have problems breathing or unusual sleepiness. You may get drowsy or dizzy. Do not drive, use machinery, or do anything that needs mental alertness until you know how this medicine affects you. Do not stand or sit up quickly, especially if you are an older patient. This reduces the risk of dizzy or fainting spells. Alcohol can increase or decrease the effects of this medicine. Avoid alcoholic drinks. You may have constipation. Try to have a bowel movement at least every 2 to 3 days. If you do not have a bowel movement for 3 days, call your doctor or health care professional. Your mouth may get dry. Chewing sugarless gum or sucking hard candy, and drinking plenty of water may help. Contact your doctor if the problem does not go away or is severe. What side effects may I notice from receiving this medicine? Side effects that you should report to your doctor or health care professional as soon as possible: -allergic reactions like skin rash, itching or hives, swelling of the face, lips, or tongue -breathing problems -confusion -seizures -signs and symptoms of low blood pressure like dizziness; feeling faint or lightheaded, falls; unusually weak or tired -trouble passing urine or change in the amount of urine Side effects that usually do not require medical attention (report to your doctor or health care professional if they continue or are bothersome): -constipation -dry  mouth -nausea, vomiting -tiredness This list may not describe all possible side effects. Call your doctor for medical advice about side effects. You may report side effects to FDA at 1-800-FDA-1088. Where should I keep my medicine? Keep out of the reach of children. This medicine may cause accidental overdose and death if it taken by other adults, children, or pets. Mix any unused medicine with a substance like cat litter or coffee grounds. Then throw the medicine away in a sealed container like a sealed bag or a coffee can with a lid. Do not use the medicine after the expiration date. Store at room temperature between 15 and 30 degrees C (59 and 86 degrees F). NOTE: This sheet is a summary. It may not cover all possible information. If you have questions about this medicine, talk to your doctor, pharmacist, or health care provider.  2018 Elsevier/Gold Standard (2015-03-01 09:00:04)  

## 2017-09-06 DIAGNOSIS — Z96642 Presence of left artificial hip joint: Secondary | ICD-10-CM | POA: Diagnosis not present

## 2017-09-06 DIAGNOSIS — Z96641 Presence of right artificial hip joint: Secondary | ICD-10-CM | POA: Diagnosis not present

## 2017-09-12 DIAGNOSIS — M7542 Impingement syndrome of left shoulder: Secondary | ICD-10-CM | POA: Diagnosis not present

## 2017-09-12 DIAGNOSIS — M24112 Other articular cartilage disorders, left shoulder: Secondary | ICD-10-CM | POA: Diagnosis not present

## 2017-09-12 DIAGNOSIS — M19012 Primary osteoarthritis, left shoulder: Secondary | ICD-10-CM | POA: Diagnosis not present

## 2017-09-12 DIAGNOSIS — M7532 Calcific tendinitis of left shoulder: Secondary | ICD-10-CM | POA: Diagnosis not present

## 2017-09-12 DIAGNOSIS — S43432A Superior glenoid labrum lesion of left shoulder, initial encounter: Secondary | ICD-10-CM | POA: Diagnosis not present

## 2017-09-12 DIAGNOSIS — G8918 Other acute postprocedural pain: Secondary | ICD-10-CM | POA: Diagnosis not present

## 2017-09-12 DIAGNOSIS — M659 Synovitis and tenosynovitis, unspecified: Secondary | ICD-10-CM | POA: Diagnosis not present

## 2017-09-29 DIAGNOSIS — S4992XA Unspecified injury of left shoulder and upper arm, initial encounter: Secondary | ICD-10-CM | POA: Diagnosis not present

## 2017-09-29 DIAGNOSIS — M25512 Pain in left shoulder: Secondary | ICD-10-CM | POA: Diagnosis not present

## 2017-09-29 DIAGNOSIS — I700 Atherosclerosis of aorta: Secondary | ICD-10-CM | POA: Diagnosis not present

## 2017-09-30 DIAGNOSIS — M25512 Pain in left shoulder: Secondary | ICD-10-CM | POA: Diagnosis not present

## 2017-10-09 DIAGNOSIS — S46912A Strain of unspecified muscle, fascia and tendon at shoulder and upper arm level, left arm, initial encounter: Secondary | ICD-10-CM | POA: Diagnosis not present

## 2017-10-16 DIAGNOSIS — M25512 Pain in left shoulder: Secondary | ICD-10-CM | POA: Diagnosis not present

## 2017-10-16 DIAGNOSIS — M25612 Stiffness of left shoulder, not elsewhere classified: Secondary | ICD-10-CM | POA: Diagnosis not present

## 2017-10-19 DIAGNOSIS — M25612 Stiffness of left shoulder, not elsewhere classified: Secondary | ICD-10-CM | POA: Diagnosis not present

## 2017-10-19 DIAGNOSIS — M25512 Pain in left shoulder: Secondary | ICD-10-CM | POA: Diagnosis not present

## 2017-10-25 DIAGNOSIS — M25512 Pain in left shoulder: Secondary | ICD-10-CM | POA: Diagnosis not present

## 2017-10-25 DIAGNOSIS — M25612 Stiffness of left shoulder, not elsewhere classified: Secondary | ICD-10-CM | POA: Diagnosis not present

## 2017-11-01 DIAGNOSIS — M25612 Stiffness of left shoulder, not elsewhere classified: Secondary | ICD-10-CM | POA: Diagnosis not present

## 2017-11-01 DIAGNOSIS — M25512 Pain in left shoulder: Secondary | ICD-10-CM | POA: Diagnosis not present

## 2017-11-02 DIAGNOSIS — M25512 Pain in left shoulder: Secondary | ICD-10-CM | POA: Diagnosis not present

## 2017-11-02 DIAGNOSIS — M25612 Stiffness of left shoulder, not elsewhere classified: Secondary | ICD-10-CM | POA: Diagnosis not present

## 2017-11-06 DIAGNOSIS — M25612 Stiffness of left shoulder, not elsewhere classified: Secondary | ICD-10-CM | POA: Diagnosis not present

## 2017-11-06 DIAGNOSIS — M25512 Pain in left shoulder: Secondary | ICD-10-CM | POA: Diagnosis not present

## 2017-11-09 DIAGNOSIS — M25612 Stiffness of left shoulder, not elsewhere classified: Secondary | ICD-10-CM | POA: Diagnosis not present

## 2017-11-09 DIAGNOSIS — M25512 Pain in left shoulder: Secondary | ICD-10-CM | POA: Diagnosis not present

## 2017-11-15 DIAGNOSIS — M25512 Pain in left shoulder: Secondary | ICD-10-CM | POA: Diagnosis not present

## 2017-11-15 DIAGNOSIS — M25612 Stiffness of left shoulder, not elsewhere classified: Secondary | ICD-10-CM | POA: Diagnosis not present

## 2017-11-16 DIAGNOSIS — M25512 Pain in left shoulder: Secondary | ICD-10-CM | POA: Diagnosis not present

## 2017-11-16 DIAGNOSIS — M25612 Stiffness of left shoulder, not elsewhere classified: Secondary | ICD-10-CM | POA: Diagnosis not present

## 2017-11-20 DIAGNOSIS — M25612 Stiffness of left shoulder, not elsewhere classified: Secondary | ICD-10-CM | POA: Diagnosis not present

## 2017-11-20 DIAGNOSIS — M25512 Pain in left shoulder: Secondary | ICD-10-CM | POA: Diagnosis not present

## 2017-11-22 DIAGNOSIS — M25612 Stiffness of left shoulder, not elsewhere classified: Secondary | ICD-10-CM | POA: Diagnosis not present

## 2017-11-22 DIAGNOSIS — M25512 Pain in left shoulder: Secondary | ICD-10-CM | POA: Diagnosis not present

## 2017-11-27 DIAGNOSIS — M25512 Pain in left shoulder: Secondary | ICD-10-CM | POA: Diagnosis not present

## 2017-11-27 DIAGNOSIS — M25612 Stiffness of left shoulder, not elsewhere classified: Secondary | ICD-10-CM | POA: Diagnosis not present

## 2017-11-29 DIAGNOSIS — M25612 Stiffness of left shoulder, not elsewhere classified: Secondary | ICD-10-CM | POA: Diagnosis not present

## 2017-11-29 DIAGNOSIS — M25512 Pain in left shoulder: Secondary | ICD-10-CM | POA: Diagnosis not present

## 2017-12-04 DIAGNOSIS — M25612 Stiffness of left shoulder, not elsewhere classified: Secondary | ICD-10-CM | POA: Diagnosis not present

## 2017-12-04 DIAGNOSIS — M25512 Pain in left shoulder: Secondary | ICD-10-CM | POA: Diagnosis not present

## 2017-12-06 DIAGNOSIS — Z4789 Encounter for other orthopedic aftercare: Secondary | ICD-10-CM | POA: Diagnosis not present

## 2017-12-06 DIAGNOSIS — M25522 Pain in left elbow: Secondary | ICD-10-CM | POA: Diagnosis not present

## 2017-12-07 DIAGNOSIS — Z9181 History of falling: Secondary | ICD-10-CM | POA: Diagnosis not present

## 2017-12-07 DIAGNOSIS — Z79899 Other long term (current) drug therapy: Secondary | ICD-10-CM | POA: Diagnosis not present

## 2017-12-07 DIAGNOSIS — M25612 Stiffness of left shoulder, not elsewhere classified: Secondary | ICD-10-CM | POA: Diagnosis not present

## 2017-12-07 DIAGNOSIS — E039 Hypothyroidism, unspecified: Secondary | ICD-10-CM | POA: Diagnosis not present

## 2017-12-07 DIAGNOSIS — Z6821 Body mass index (BMI) 21.0-21.9, adult: Secondary | ICD-10-CM | POA: Diagnosis not present

## 2017-12-07 DIAGNOSIS — M25512 Pain in left shoulder: Secondary | ICD-10-CM | POA: Diagnosis not present

## 2017-12-07 DIAGNOSIS — Z1331 Encounter for screening for depression: Secondary | ICD-10-CM | POA: Diagnosis not present

## 2017-12-07 DIAGNOSIS — Z1211 Encounter for screening for malignant neoplasm of colon: Secondary | ICD-10-CM | POA: Diagnosis not present

## 2017-12-11 DIAGNOSIS — M25612 Stiffness of left shoulder, not elsewhere classified: Secondary | ICD-10-CM | POA: Diagnosis not present

## 2017-12-11 DIAGNOSIS — M25512 Pain in left shoulder: Secondary | ICD-10-CM | POA: Diagnosis not present

## 2017-12-14 DIAGNOSIS — M25612 Stiffness of left shoulder, not elsewhere classified: Secondary | ICD-10-CM | POA: Diagnosis not present

## 2017-12-14 DIAGNOSIS — M25512 Pain in left shoulder: Secondary | ICD-10-CM | POA: Diagnosis not present

## 2017-12-18 DIAGNOSIS — M25612 Stiffness of left shoulder, not elsewhere classified: Secondary | ICD-10-CM | POA: Diagnosis not present

## 2017-12-18 DIAGNOSIS — M25512 Pain in left shoulder: Secondary | ICD-10-CM | POA: Diagnosis not present

## 2018-01-01 DIAGNOSIS — Z1212 Encounter for screening for malignant neoplasm of rectum: Secondary | ICD-10-CM | POA: Diagnosis not present

## 2018-01-01 DIAGNOSIS — Z1211 Encounter for screening for malignant neoplasm of colon: Secondary | ICD-10-CM | POA: Diagnosis not present

## 2018-01-15 DIAGNOSIS — M25512 Pain in left shoulder: Secondary | ICD-10-CM | POA: Diagnosis not present

## 2018-01-15 DIAGNOSIS — M25612 Stiffness of left shoulder, not elsewhere classified: Secondary | ICD-10-CM | POA: Diagnosis not present

## 2018-01-23 DIAGNOSIS — H26492 Other secondary cataract, left eye: Secondary | ICD-10-CM | POA: Diagnosis not present

## 2018-02-12 DIAGNOSIS — L905 Scar conditions and fibrosis of skin: Secondary | ICD-10-CM | POA: Diagnosis not present

## 2018-02-12 DIAGNOSIS — L821 Other seborrheic keratosis: Secondary | ICD-10-CM | POA: Diagnosis not present

## 2018-02-12 DIAGNOSIS — D485 Neoplasm of uncertain behavior of skin: Secondary | ICD-10-CM | POA: Diagnosis not present

## 2018-02-12 DIAGNOSIS — L988 Other specified disorders of the skin and subcutaneous tissue: Secondary | ICD-10-CM | POA: Diagnosis not present

## 2018-02-12 DIAGNOSIS — L820 Inflamed seborrheic keratosis: Secondary | ICD-10-CM | POA: Diagnosis not present

## 2018-02-12 DIAGNOSIS — D225 Melanocytic nevi of trunk: Secondary | ICD-10-CM | POA: Diagnosis not present

## 2018-02-12 DIAGNOSIS — L578 Other skin changes due to chronic exposure to nonionizing radiation: Secondary | ICD-10-CM | POA: Diagnosis not present

## 2018-02-26 DIAGNOSIS — H5213 Myopia, bilateral: Secondary | ICD-10-CM | POA: Diagnosis not present

## 2018-03-08 DIAGNOSIS — D485 Neoplasm of uncertain behavior of skin: Secondary | ICD-10-CM | POA: Diagnosis not present

## 2018-03-21 DIAGNOSIS — Z0000 Encounter for general adult medical examination without abnormal findings: Secondary | ICD-10-CM | POA: Diagnosis not present

## 2018-03-21 DIAGNOSIS — Z9181 History of falling: Secondary | ICD-10-CM | POA: Diagnosis not present

## 2018-03-21 DIAGNOSIS — Z1339 Encounter for screening examination for other mental health and behavioral disorders: Secondary | ICD-10-CM | POA: Diagnosis not present

## 2018-03-21 DIAGNOSIS — Z1239 Encounter for other screening for malignant neoplasm of breast: Secondary | ICD-10-CM | POA: Diagnosis not present

## 2018-03-21 DIAGNOSIS — N959 Unspecified menopausal and perimenopausal disorder: Secondary | ICD-10-CM | POA: Diagnosis not present

## 2018-03-21 DIAGNOSIS — Z139 Encounter for screening, unspecified: Secondary | ICD-10-CM | POA: Diagnosis not present

## 2018-04-18 DIAGNOSIS — E2839 Other primary ovarian failure: Secondary | ICD-10-CM | POA: Diagnosis not present

## 2018-04-18 DIAGNOSIS — M810 Age-related osteoporosis without current pathological fracture: Secondary | ICD-10-CM | POA: Diagnosis not present

## 2018-04-18 DIAGNOSIS — Z1231 Encounter for screening mammogram for malignant neoplasm of breast: Secondary | ICD-10-CM | POA: Diagnosis not present

## 2018-05-22 IMAGING — CT CT ANGIO NECK
1 of 5 series · 3 of 16 positions shown · IV contrast (isovue)
Comparison: None.

CLINICAL DATA: Left eye pain and vision changes. Headache. Neck
pain in the left carotid area, evaluate for dissection. Patient
reportedly had a negative brain MRI and has normal ESR and CRP.

EXAM:
CT ANGIOGRAPHY HEAD AND NECK
TECHNIQUE: Multidetector CT imaging of the head and neck was performed using
the standard protocol during bolus administration of intravenous
contrast. Multiplanar CT image reconstructions and MIPs were
obtained to evaluate the vascular anatomy. Carotid stenosis
measurements (when applicable) are obtained utilizing NASCET
criteria, using the distal internal carotid diameter as the
denominator.
CONTRAST:  90 cc Isovue 370 intravenous

[Series 10: head/neck angio · axial · 0.42mm/px · z∈[-330,+9]mm · 3 of 114 slices shown]
[im 1/114  soft-tissue]
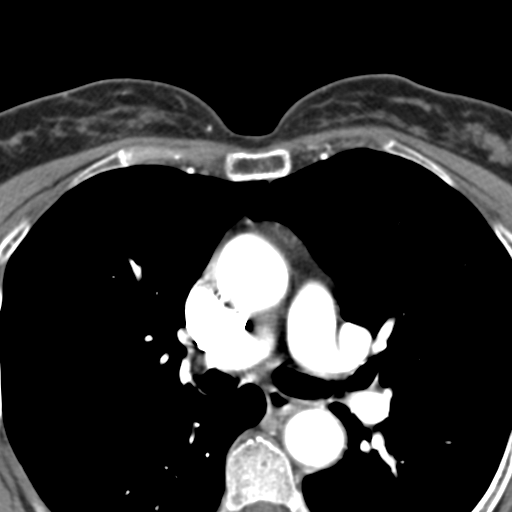
[im 57/114  bone]
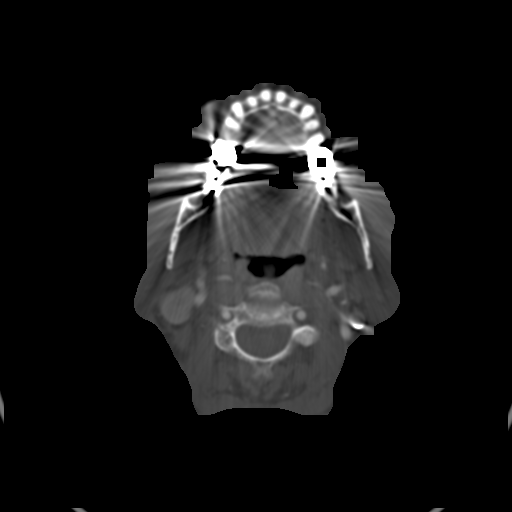
[im 114/114  soft-tissue]
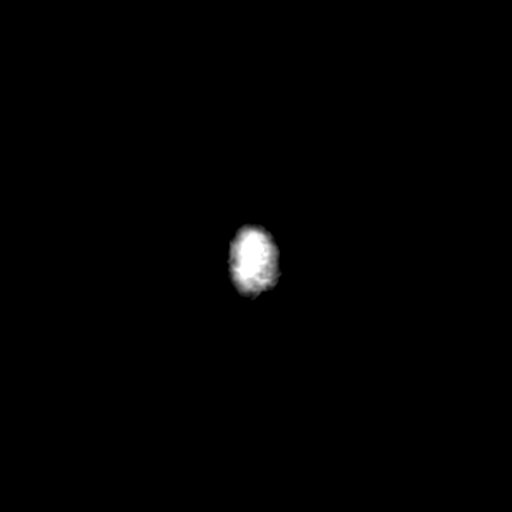

[3 of 16 positions shown; findings below may reference images not displayed]

FINDINGS: CT HEAD FINDINGS

Brain: No evidence of acute infarction, hemorrhage, hydrocephalus,
extra-axial collection or mass lesion/mass effect. Few patchy low
densities in the cerebral white matter, most prominent in the
lateral right frontal region. There is history of presumed chronic
microvascular ischemia on recent brain MRI. Normal brain volume.

Vascular: See below

Skull: No acute or aggressive finding.

Sinuses: Clear.

Orbits: Bilateral cataract resection. No pathologic finding or
explanation for headache.

Review of the MIP images confirms the above findings

CTA NECK FINDINGS

Aortic arch: Atheromatous wall thickening and occasional
calcification. Three vessel branching pattern no acute finding or
visualized dilatation.

Right carotid system: Mild atheromatous plaque at the common carotid
bifurcation. Vessels are smooth and widely patent to the dura.

Left carotid system: Mild atheromatous plaque at the common carotid
bifurcation. Unavoidable artifact from dental amalgam which occur is
at the level of the proximal ICA. There is still reasonable vessel
visualization without evidence of dissection. No stenosis or
ulceration.

Vertebral arteries: Moderate atheromatous changes on the left
subclavian artery both proximal and distal to the left vertebral
artery origin. The vertebral arteries are smooth and widely patent
to the dura.

Skeleton: No acute or aggressive finding.

Other neck: No incidental inflammation or aggressive finding.
Low-density in the left vallecula is likely a mucous retention cyst.

Upper chest: No acute finding.  Mild biapical pleural thickening.

Review of the MIP images confirms the above findings

CTA HEAD FINDINGS

Anterior circulation: Atherosclerotic calcification on the carotid
siphons without stenosis or ulceration. Symmetric visualization of
the ophthalmic arteries. No branch occlusion or flow limiting
stenosis. Negative for aneurysm. Symmetric appearance of the
temporal arteries that do not appear beaded or stenotic.

Posterior circulation: Nearly codominant vertebral arteries.
Dominant left PICA and right AICA. Vessels are smooth and widely
patent.

Venous sinuses: Diffusely patent.

Anatomic variants: None significant.

Delayed phase: No abnormal enhancement.

Review of the MIP images confirms the above findings
IMPRESSION: 1. No explanation for neck pain and headache. Negative for left
carotid dissection.
2. Mild intracranial and cervical atherosclerosis without flow
limiting stenosis or ulceration.

## 2018-07-05 DIAGNOSIS — Z6822 Body mass index (BMI) 22.0-22.9, adult: Secondary | ICD-10-CM | POA: Diagnosis not present

## 2018-07-05 DIAGNOSIS — M810 Age-related osteoporosis without current pathological fracture: Secondary | ICD-10-CM | POA: Diagnosis not present

## 2018-07-05 DIAGNOSIS — Z79899 Other long term (current) drug therapy: Secondary | ICD-10-CM | POA: Diagnosis not present

## 2018-07-05 DIAGNOSIS — E039 Hypothyroidism, unspecified: Secondary | ICD-10-CM | POA: Diagnosis not present

## 2018-08-16 DIAGNOSIS — H35371 Puckering of macula, right eye: Secondary | ICD-10-CM | POA: Diagnosis not present

## 2018-08-16 DIAGNOSIS — H43813 Vitreous degeneration, bilateral: Secondary | ICD-10-CM | POA: Diagnosis not present

## 2018-08-16 DIAGNOSIS — H02886 Meibomian gland dysfunction of left eye, unspecified eyelid: Secondary | ICD-10-CM | POA: Diagnosis not present

## 2018-08-16 DIAGNOSIS — H02883 Meibomian gland dysfunction of right eye, unspecified eyelid: Secondary | ICD-10-CM | POA: Diagnosis not present

## 2018-09-17 DIAGNOSIS — M9902 Segmental and somatic dysfunction of thoracic region: Secondary | ICD-10-CM | POA: Diagnosis not present

## 2018-09-17 DIAGNOSIS — M5442 Lumbago with sciatica, left side: Secondary | ICD-10-CM | POA: Diagnosis not present

## 2018-09-17 DIAGNOSIS — M9904 Segmental and somatic dysfunction of sacral region: Secondary | ICD-10-CM | POA: Diagnosis not present

## 2018-09-17 DIAGNOSIS — S46912A Strain of unspecified muscle, fascia and tendon at shoulder and upper arm level, left arm, initial encounter: Secondary | ICD-10-CM | POA: Diagnosis not present

## 2018-09-17 DIAGNOSIS — M5136 Other intervertebral disc degeneration, lumbar region: Secondary | ICD-10-CM | POA: Diagnosis not present

## 2018-09-17 DIAGNOSIS — M546 Pain in thoracic spine: Secondary | ICD-10-CM | POA: Diagnosis not present

## 2018-09-17 DIAGNOSIS — M25612 Stiffness of left shoulder, not elsewhere classified: Secondary | ICD-10-CM | POA: Diagnosis not present

## 2018-09-17 DIAGNOSIS — M9903 Segmental and somatic dysfunction of lumbar region: Secondary | ICD-10-CM | POA: Diagnosis not present

## 2018-11-26 DIAGNOSIS — L578 Other skin changes due to chronic exposure to nonionizing radiation: Secondary | ICD-10-CM | POA: Diagnosis not present

## 2018-11-26 DIAGNOSIS — L821 Other seborrheic keratosis: Secondary | ICD-10-CM | POA: Diagnosis not present

## 2018-11-26 DIAGNOSIS — L820 Inflamed seborrheic keratosis: Secondary | ICD-10-CM | POA: Diagnosis not present

## 2018-11-26 DIAGNOSIS — Z85820 Personal history of malignant melanoma of skin: Secondary | ICD-10-CM | POA: Diagnosis not present

## 2019-01-24 DIAGNOSIS — M810 Age-related osteoporosis without current pathological fracture: Secondary | ICD-10-CM | POA: Diagnosis not present

## 2019-01-24 DIAGNOSIS — Z79899 Other long term (current) drug therapy: Secondary | ICD-10-CM | POA: Diagnosis not present

## 2019-01-24 DIAGNOSIS — Z6822 Body mass index (BMI) 22.0-22.9, adult: Secondary | ICD-10-CM | POA: Diagnosis not present

## 2019-01-24 DIAGNOSIS — E039 Hypothyroidism, unspecified: Secondary | ICD-10-CM | POA: Diagnosis not present

## 2019-04-29 DIAGNOSIS — M9903 Segmental and somatic dysfunction of lumbar region: Secondary | ICD-10-CM | POA: Diagnosis not present

## 2019-04-29 DIAGNOSIS — M9902 Segmental and somatic dysfunction of thoracic region: Secondary | ICD-10-CM | POA: Diagnosis not present

## 2019-04-29 DIAGNOSIS — M9904 Segmental and somatic dysfunction of sacral region: Secondary | ICD-10-CM | POA: Diagnosis not present

## 2019-04-29 DIAGNOSIS — M25612 Stiffness of left shoulder, not elsewhere classified: Secondary | ICD-10-CM | POA: Diagnosis not present

## 2019-04-29 DIAGNOSIS — M5442 Lumbago with sciatica, left side: Secondary | ICD-10-CM | POA: Diagnosis not present

## 2019-04-29 DIAGNOSIS — S46912A Strain of unspecified muscle, fascia and tendon at shoulder and upper arm level, left arm, initial encounter: Secondary | ICD-10-CM | POA: Diagnosis not present

## 2019-04-29 DIAGNOSIS — M5136 Other intervertebral disc degeneration, lumbar region: Secondary | ICD-10-CM | POA: Diagnosis not present

## 2019-04-29 DIAGNOSIS — M546 Pain in thoracic spine: Secondary | ICD-10-CM | POA: Diagnosis not present

## 2019-05-20 DIAGNOSIS — M9902 Segmental and somatic dysfunction of thoracic region: Secondary | ICD-10-CM | POA: Diagnosis not present

## 2019-05-20 DIAGNOSIS — M5136 Other intervertebral disc degeneration, lumbar region: Secondary | ICD-10-CM | POA: Diagnosis not present

## 2019-05-20 DIAGNOSIS — M9903 Segmental and somatic dysfunction of lumbar region: Secondary | ICD-10-CM | POA: Diagnosis not present

## 2019-05-20 DIAGNOSIS — S46912A Strain of unspecified muscle, fascia and tendon at shoulder and upper arm level, left arm, initial encounter: Secondary | ICD-10-CM | POA: Diagnosis not present

## 2019-05-20 DIAGNOSIS — M5442 Lumbago with sciatica, left side: Secondary | ICD-10-CM | POA: Diagnosis not present

## 2019-05-20 DIAGNOSIS — M9904 Segmental and somatic dysfunction of sacral region: Secondary | ICD-10-CM | POA: Diagnosis not present

## 2019-05-20 DIAGNOSIS — M546 Pain in thoracic spine: Secondary | ICD-10-CM | POA: Diagnosis not present

## 2019-05-20 DIAGNOSIS — M25612 Stiffness of left shoulder, not elsewhere classified: Secondary | ICD-10-CM | POA: Diagnosis not present

## 2019-05-22 DIAGNOSIS — M9904 Segmental and somatic dysfunction of sacral region: Secondary | ICD-10-CM | POA: Diagnosis not present

## 2019-05-22 DIAGNOSIS — M9903 Segmental and somatic dysfunction of lumbar region: Secondary | ICD-10-CM | POA: Diagnosis not present

## 2019-05-22 DIAGNOSIS — M9902 Segmental and somatic dysfunction of thoracic region: Secondary | ICD-10-CM | POA: Diagnosis not present

## 2019-05-22 DIAGNOSIS — M546 Pain in thoracic spine: Secondary | ICD-10-CM | POA: Diagnosis not present

## 2019-05-22 DIAGNOSIS — M25612 Stiffness of left shoulder, not elsewhere classified: Secondary | ICD-10-CM | POA: Diagnosis not present

## 2019-05-22 DIAGNOSIS — M5136 Other intervertebral disc degeneration, lumbar region: Secondary | ICD-10-CM | POA: Diagnosis not present

## 2019-05-22 DIAGNOSIS — M5442 Lumbago with sciatica, left side: Secondary | ICD-10-CM | POA: Diagnosis not present

## 2019-05-22 DIAGNOSIS — S46912A Strain of unspecified muscle, fascia and tendon at shoulder and upper arm level, left arm, initial encounter: Secondary | ICD-10-CM | POA: Diagnosis not present

## 2019-05-27 DIAGNOSIS — M9902 Segmental and somatic dysfunction of thoracic region: Secondary | ICD-10-CM | POA: Diagnosis not present

## 2019-05-27 DIAGNOSIS — M9904 Segmental and somatic dysfunction of sacral region: Secondary | ICD-10-CM | POA: Diagnosis not present

## 2019-05-27 DIAGNOSIS — M25612 Stiffness of left shoulder, not elsewhere classified: Secondary | ICD-10-CM | POA: Diagnosis not present

## 2019-05-27 DIAGNOSIS — S46912A Strain of unspecified muscle, fascia and tendon at shoulder and upper arm level, left arm, initial encounter: Secondary | ICD-10-CM | POA: Diagnosis not present

## 2019-05-27 DIAGNOSIS — M5136 Other intervertebral disc degeneration, lumbar region: Secondary | ICD-10-CM | POA: Diagnosis not present

## 2019-05-27 DIAGNOSIS — M546 Pain in thoracic spine: Secondary | ICD-10-CM | POA: Diagnosis not present

## 2019-05-27 DIAGNOSIS — M5442 Lumbago with sciatica, left side: Secondary | ICD-10-CM | POA: Diagnosis not present

## 2019-05-27 DIAGNOSIS — M9903 Segmental and somatic dysfunction of lumbar region: Secondary | ICD-10-CM | POA: Diagnosis not present

## 2019-05-29 DIAGNOSIS — M5136 Other intervertebral disc degeneration, lumbar region: Secondary | ICD-10-CM | POA: Diagnosis not present

## 2019-05-29 DIAGNOSIS — M25612 Stiffness of left shoulder, not elsewhere classified: Secondary | ICD-10-CM | POA: Diagnosis not present

## 2019-05-29 DIAGNOSIS — M9902 Segmental and somatic dysfunction of thoracic region: Secondary | ICD-10-CM | POA: Diagnosis not present

## 2019-05-29 DIAGNOSIS — S46912A Strain of unspecified muscle, fascia and tendon at shoulder and upper arm level, left arm, initial encounter: Secondary | ICD-10-CM | POA: Diagnosis not present

## 2019-05-29 DIAGNOSIS — M9903 Segmental and somatic dysfunction of lumbar region: Secondary | ICD-10-CM | POA: Diagnosis not present

## 2019-05-29 DIAGNOSIS — M9904 Segmental and somatic dysfunction of sacral region: Secondary | ICD-10-CM | POA: Diagnosis not present

## 2019-05-29 DIAGNOSIS — M5442 Lumbago with sciatica, left side: Secondary | ICD-10-CM | POA: Diagnosis not present

## 2019-05-29 DIAGNOSIS — M546 Pain in thoracic spine: Secondary | ICD-10-CM | POA: Diagnosis not present

## 2019-05-31 DIAGNOSIS — M9901 Segmental and somatic dysfunction of cervical region: Secondary | ICD-10-CM | POA: Diagnosis not present

## 2019-05-31 DIAGNOSIS — R519 Headache, unspecified: Secondary | ICD-10-CM | POA: Diagnosis not present

## 2019-05-31 DIAGNOSIS — M50323 Other cervical disc degeneration at C6-C7 level: Secondary | ICD-10-CM | POA: Diagnosis not present

## 2019-05-31 DIAGNOSIS — R221 Localized swelling, mass and lump, neck: Secondary | ICD-10-CM | POA: Diagnosis not present

## 2019-05-31 DIAGNOSIS — G44201 Tension-type headache, unspecified, intractable: Secondary | ICD-10-CM | POA: Diagnosis not present

## 2019-05-31 DIAGNOSIS — M5413 Radiculopathy, cervicothoracic region: Secondary | ICD-10-CM | POA: Diagnosis not present

## 2019-06-05 DIAGNOSIS — Z79899 Other long term (current) drug therapy: Secondary | ICD-10-CM | POA: Diagnosis not present

## 2019-06-05 DIAGNOSIS — M810 Age-related osteoporosis without current pathological fracture: Secondary | ICD-10-CM | POA: Diagnosis not present

## 2019-06-05 DIAGNOSIS — Z1331 Encounter for screening for depression: Secondary | ICD-10-CM | POA: Diagnosis not present

## 2019-06-05 DIAGNOSIS — R590 Localized enlarged lymph nodes: Secondary | ICD-10-CM | POA: Diagnosis not present

## 2019-06-05 DIAGNOSIS — Z2821 Immunization not carried out because of patient refusal: Secondary | ICD-10-CM | POA: Diagnosis not present

## 2019-06-05 DIAGNOSIS — Z1231 Encounter for screening mammogram for malignant neoplasm of breast: Secondary | ICD-10-CM | POA: Diagnosis not present

## 2019-06-05 DIAGNOSIS — Z9181 History of falling: Secondary | ICD-10-CM | POA: Diagnosis not present

## 2019-06-05 DIAGNOSIS — Z6822 Body mass index (BMI) 22.0-22.9, adult: Secondary | ICD-10-CM | POA: Diagnosis not present

## 2019-06-05 DIAGNOSIS — Z139 Encounter for screening, unspecified: Secondary | ICD-10-CM | POA: Diagnosis not present

## 2019-06-05 DIAGNOSIS — E039 Hypothyroidism, unspecified: Secondary | ICD-10-CM | POA: Diagnosis not present

## 2019-06-06 DIAGNOSIS — M9902 Segmental and somatic dysfunction of thoracic region: Secondary | ICD-10-CM | POA: Diagnosis not present

## 2019-06-06 DIAGNOSIS — M5442 Lumbago with sciatica, left side: Secondary | ICD-10-CM | POA: Diagnosis not present

## 2019-06-06 DIAGNOSIS — M5136 Other intervertebral disc degeneration, lumbar region: Secondary | ICD-10-CM | POA: Diagnosis not present

## 2019-06-06 DIAGNOSIS — M9903 Segmental and somatic dysfunction of lumbar region: Secondary | ICD-10-CM | POA: Diagnosis not present

## 2019-06-06 DIAGNOSIS — M546 Pain in thoracic spine: Secondary | ICD-10-CM | POA: Diagnosis not present

## 2019-06-06 DIAGNOSIS — S46912A Strain of unspecified muscle, fascia and tendon at shoulder and upper arm level, left arm, initial encounter: Secondary | ICD-10-CM | POA: Diagnosis not present

## 2019-06-06 DIAGNOSIS — M9904 Segmental and somatic dysfunction of sacral region: Secondary | ICD-10-CM | POA: Diagnosis not present

## 2019-06-06 DIAGNOSIS — M25612 Stiffness of left shoulder, not elsewhere classified: Secondary | ICD-10-CM | POA: Diagnosis not present

## 2019-06-07 DIAGNOSIS — M25612 Stiffness of left shoulder, not elsewhere classified: Secondary | ICD-10-CM | POA: Diagnosis not present

## 2019-06-07 DIAGNOSIS — M546 Pain in thoracic spine: Secondary | ICD-10-CM | POA: Diagnosis not present

## 2019-06-07 DIAGNOSIS — M9902 Segmental and somatic dysfunction of thoracic region: Secondary | ICD-10-CM | POA: Diagnosis not present

## 2019-06-07 DIAGNOSIS — S46912A Strain of unspecified muscle, fascia and tendon at shoulder and upper arm level, left arm, initial encounter: Secondary | ICD-10-CM | POA: Diagnosis not present

## 2019-06-07 DIAGNOSIS — M9904 Segmental and somatic dysfunction of sacral region: Secondary | ICD-10-CM | POA: Diagnosis not present

## 2019-06-07 DIAGNOSIS — M5136 Other intervertebral disc degeneration, lumbar region: Secondary | ICD-10-CM | POA: Diagnosis not present

## 2019-06-07 DIAGNOSIS — M9903 Segmental and somatic dysfunction of lumbar region: Secondary | ICD-10-CM | POA: Diagnosis not present

## 2019-06-07 DIAGNOSIS — M5442 Lumbago with sciatica, left side: Secondary | ICD-10-CM | POA: Diagnosis not present

## 2019-06-11 DIAGNOSIS — R599 Enlarged lymph nodes, unspecified: Secondary | ICD-10-CM | POA: Diagnosis not present

## 2019-06-11 DIAGNOSIS — E079 Disorder of thyroid, unspecified: Secondary | ICD-10-CM | POA: Diagnosis not present

## 2019-06-11 DIAGNOSIS — R590 Localized enlarged lymph nodes: Secondary | ICD-10-CM | POA: Diagnosis not present

## 2019-06-25 DIAGNOSIS — Z0000 Encounter for general adult medical examination without abnormal findings: Secondary | ICD-10-CM | POA: Diagnosis not present

## 2019-06-25 DIAGNOSIS — Z1231 Encounter for screening mammogram for malignant neoplasm of breast: Secondary | ICD-10-CM | POA: Diagnosis not present

## 2019-06-25 DIAGNOSIS — Z9181 History of falling: Secondary | ICD-10-CM | POA: Diagnosis not present

## 2019-06-25 DIAGNOSIS — Z1331 Encounter for screening for depression: Secondary | ICD-10-CM | POA: Diagnosis not present

## 2019-07-22 NOTE — Progress Notes (Signed)
[facility]    Provider:  Dr Jaynee Eagles Referring Provider: [name]., MD Primary Care Physician:  [name]., MD CC:  Persistent headaches, hx of migraines  Interval history 07/23/2019: Here for follow up of headaches. Originally seen in 2018 for headaches, Patient was originally seen for continuous headache beginning of August 2018. Usually over the left eye or on top of the head never in the back. She has a past medical history of migraines. The headaches are worsening. She also had neck pain in the area of the carotid but CTA head/neck were unremarkable.  I reviewed CT of the head report that showed no acute findings, mild bilateral ethmoid sinus mucosal thickening dated May 30, 2019, I reviewed recent labs June 06, 2019 which included normal CMP with BUN 18 creatinine 0.7, TSH 3.3 normal.  She had surgery on her left shoulder and the headaches had improved since last being seen, started becoming more frequent late 2020, she saw the chiropractor, she had a nodule on the left side and had an ultrasound, she has a headache on the left side of the scalp, can spread to the back of the head and neck, more pressure, unilateral, no snoring at night, doesn't wake up with the headaches often, no excessive daytime tiredness, continuous, can be significant and severe, nothing OTC really helps, impedes her sleeping, laying down hurts, it never goes away. She has a history of migraines. She had an MRI of the brain 2018 at [facility], reviewed report, MRi normal for age.  Interval history 08/30/2017: She is here for follow up. She saw a chiropractor and was feeling better. In December she fell and injured her rotator cuff. The headaches worsened. She can hardly move her left shoulder. The headaches are worsening with neck pain and shoulder injury. The pain radiates down the arm and to the thimb. The left arm is painful. Her left trap is very tight. Discussed PT and dry  needling. She has had shoulder problems for years. Unclear if migraine is secindary to neck pain or vice versa with concomitant rotator cuff irritation.  CTA head and neck: reviewed images and agree, reviewed with patient and husband 1. No explanation for neck pain and headache. Negative for left carotid dissection. 2. Mild intracranial and cervical atherosclerosis without flow limiting stenosis or ulceration.  Interval history 03/20/2017: Patient was originally seen for continuous headache beginning of August. Usually over the left eye or on top of the head never in the back. She has a past medical history of migraines. The headaches are worsening. She has never been a complainer. She is in 6-7 pain currently. Severe pressure on the left side of the eye. Her vision is worsening in the left eye. Light, sound doesn't bother her. She doesn't have to sit still. No vomiting, no nausea, no light or sound sensitivity, no eye watering or other autonomic symptoms. Always on the left never on the right. She is teary in the office. No jaw claudication or axial pain. No snoring. No grinding of teeth. No associated neck pain or tightness. The headache waxes and wanes. No OTC medication.  Pain also in the neck area with vision changes.  Meds tried for headache: Robaxin, Reglan, Zofran,   HPI:  Madison Alvarez is a 74 y.o. female here as a referral from Dr. Megan Salon for headache. Past medical history of hypothyroidism, hip replacement, smell and taste disturbance, insomnia, monoclonal paraproteinemia, osteoporosis. Has migraines occasionally. Continuous headache starting the beginning of August.  She hs tried indomethacin.Usually over the left eye or on the top of the head, never in the back. Vision changes in the right eye blurry justin the peripheral vision lasted a few hours which she sometimes gets with occassional migraines.This is a new headache, she has not had any headaches like this or last this long or be like  this with difficulty concentrating, wakes with headaches. No inciting event. Waxes and wanes. Sometimes it is dull, at its worse pressure on on the left, with vision changes. No light or sound sensitivity. Light bothered her the other day however. She has lost smell and taste. No chewing difficulty but there is fatigue, no fevers. No diplopia. Generalized fatigue. No new weakness, feels strong. No nausea.   Reviewed notes, labs and imaging from outside physicians, which showed:  Review primary care notes. This is a 74 year old female with headache. Also reports weakness. No nausea, vomiting or photophobia. Onset was 2 weeks prior to appointment on August 20. Patient described it as severe. Pain behind the left eye, crest of the head, squeezing sensation for 2 weeks worsening.  Vitamin D 25-hydroxy was 35.1, vitamin B12 683, folate normal, CBC unremarkable, CMP unremarkable with BUN 13 and creatinine 0.68.  Review of Systems: Patient complains of symptoms per HPI as well as the following symptoms: no CP, no SOB, cervical neckpain. Pertinent negatives and positives per HPI. All others negative.   Social History   Socioeconomic History  . Marital status: Married    Spouse name: Not on file  . Number of children: 2  . Years of education: Not on file  . Highest education level: High school graduate  Occupational History  . Not on file  Tobacco Use  . Smoking status: Never Smoker  . Smokeless tobacco: Never Used  Substance and Sexual Activity  . Alcohol use: Yes    Alcohol/week: 8.0 standard drinks    Types: 8 Standard drinks or equivalent per week    Comment: 2 drinks 4 times per week  . Drug use: Never  . Sexual activity: Not on file  Other Topics Concern  . Not on file  Social History Narrative   Lives at home with her husband   Right handed   Retired   Drinks 2-3 cups of caffeine daily (if she drinks a third cup in the evening its decaf)   Social Determinants of Health    Financial Resource Strain:   . Difficulty of Paying Living Expenses: Not on file  Food Insecurity:   . Worried About Charity fundraiser in the Last Year: Not on file  . Ran Out of Food in the Last Year: Not on file  Transportation Needs:   . Lack of Transportation (Medical): Not on file  . Lack of Transportation (Non-Medical): Not on file  Physical Activity:   . Days of Exercise per Week: Not on file  . Minutes of Exercise per Session: Not on file  Stress:   . Feeling of Stress : Not on file  Social Connections:   . Frequency of Communication with Friends and Family: Not on file  . Frequency of Social Gatherings with Friends and Family: Not on file  . Attends Religious Services: Not on file  . Active Member of Clubs or Organizations: Not on file  . Attends Archivist Meetings: Not on file  . Marital Status: Not on file  Intimate Partner Violence:   . Fear of Current or Ex-Partner: Not on file  . Emotionally  Abused: Not on file  . Physically Abused: Not on file  . Sexually Abused: Not on file    Family History  Problem Relation Age of Onset  . Epilepsy Mother        passed of seizure in late 56s   . Suicidality Father        mid 57s    Past Medical History:  Diagnosis Date  . 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 Blood dyscrasia    Dr. Jana Hakim follows"says nothing to be concerned about"  . Cancer [facility])    Melanoma '02- excised left hip-  . Chest pain    7-8 yrs ago -all heart tests negative for heart issues  . Hypothyroidism   . Meningitis spinal 1980    Past Surgical History:  Procedure Laterality Date  . CATARACT EXTRACTION, BILATERAL    . JOINT REPLACEMENT     right hip  . MELANOMA EXCISION Left    '02- left hip  . SHOULDER SURGERY Left 2019  . TOTAL HIP ARTHROPLASTY Right 09/01/2015   Procedure: RIGHT TOTAL HIP ARTHROPLASTY ANTERIOR APPROACH;  Surgeon: Paralee Cancel, MD;   Location: [facility];  Service: Orthopedics;  Laterality: Right;  . TOTAL HIP ARTHROPLASTY Left 08/30/2016   Procedure: LEFT TOTAL HIP ARTHROPLASTY ANTERIOR APPROACH;  Surgeon: Paralee Cancel, MD;  Location: [facility];  Service: Orthopedics;  Laterality: Left;  . TUBAL LIGATION      Current Outpatient Medications  Medication Sig Dispense Refill  . Bioflavonoid Products (ESTER C PO) Take 1,000 mg by mouth daily.     . Calcium Carb-Cholecalciferol (CALCIUM 600+D3) 600-800 MG-UNIT TABS Take by mouth daily.    . Multiple Vitamin (MULTIVITAMIN WITH MINERALS) TABS tablet Take 1 tablet by mouth daily. Centrum silver    . Fremanezumab-vfrm (AJOVY) 225 MG/1.5ML SOAJ Inject 225 mg into the skin every 30 (thirty) days. 1 pen 11  . levothyroxine (SYNTHROID) 125 MCG tablet Take 125 mcg by mouth daily.     No current facility-administered medications for this visit.    Allergies as of 07/23/2019 - Review Complete 07/23/2019  Allergen Reaction Noted  . Protein fortified cookie [nutritional supplements] Hives 09/01/2015  . Penicillins Hives and Swelling 08/20/2015  . Iodinated diagnostic agents Hives 08/20/2015    Vitals: BP 112/61 (BP Location: Right Arm, Patient Position: Sitting)   Pulse 69   Temp 97.9 F (36.6 C) Comment: taken at front  Ht 5' 1.75" (1.568 m)   Wt 120 lb (54.4 kg)   BMI 22.13 kg/m  Last Weight:  Wt Readings from Last 1 Encounters:  07/23/19 120 lb (54.4 kg)   Last Height:   Ht Readings from Last 1 Encounters:  07/23/19 5' 1.75" (1.568 m)    Physical exam: Exam: Gen: NAD, conversant, well nourised, thin, well groomed                     CV: RRR, no MRG. No Carotid Bruits. No peripheral edema, warm, nontender Eyes: Conjunctivae clear without exudates or hemorrhage  Neuro: Detailed Neurologic Exam  Speech:    Speech is normal; fluent and spontaneous with normal comprehension.  Cognition:    The patient is oriented to person, place, and time;     recent and remote memory  intact;     language fluent;     normal attention, concentration,     fund of knowledge Cranial Nerves:    The pupils are equal, round,  and reactive to light. Visual fields are full to finger confrontation. Extraocular movements are intact. Trigeminal sensation is intact and the muscles of mastication are normal. The face is symmetric. The palate elevates in the midline. Hearing intact. Voice is normal. Shoulder shrug is normal. The tongue has normal motion without fasciculations.   Coordination:    Normal finger to nose and heel to shin.   Motor Observation:    No asymmetry, no atrophy, and no involuntary movements noted. Tone:    Normal muscle tone.    Posture:    Posture is normal. normal erect    Strength:    Strength is V/V in the upper and lower limbs.      Assessment/Plan:  74 year old with a PMHx migraines here again  for left-sided continuous headache, continuous for months, she had similar 2 years ago, she has been thoroughly evaluated with imaging, recommend treating for migraines. Discussed trying to treat for migraines, we could do oral medications she does not want daily medications, then we could try to get Ajovy approved. She denies any cervical muscle pain. If Ajovy declined we will provide samples.   - MRI brain w/wo contrast was unremarkable 2018 [facility], CTA head and neck 03/2017, recent CT head all unremarkable - esr, crp were normal in the past, this is similar presentation for headache as in the past I suggest treating for migraine - steroids: none indicated. No other symptoms of GCA, no vision changes, no fevers, no neck or axial pain, no jaw claudication - Discussed dry needling, she had left sided cervical muscle pain, likely causing headache/cervicalgia and triggering migraines in the past but this time she says she denies any current cervical muscle tightness.   - We can try to get Ajovy approved which may be difficult because she has never tried a  preventative, we also discussed   To prevent or relieve headaches, try the following: Cool Compress. Lie down and place a cool compress on your head.  Avoid headache triggers. If certain foods or odors seem to have triggered your migraines in the past, avoid them. A headache diary might help you identify triggers.  Include physical activity in your daily routine. Try a daily walk or other moderate aerobic exercise.  Manage stress. Find healthy ways to cope with the stressors, such as delegating tasks on your to-do list.  Practice relaxation techniques. Try deep breathing, yoga, massage and visualization.  Eat regularly. Eating regularly scheduled meals and maintaining a healthy diet might help prevent headaches. Also, drink plenty of fluids.  Follow a regular sleep schedule. Sleep deprivation might contribute to headaches Consider biofeedback. With this mind-body technique, you learn to control certain bodily functions -- such as muscle tension, heart rate and blood pressure -- to prevent headaches or reduce headache pain.    Proceed to emergency room if you experience new or worsening symptoms or symptoms do not resolve, if you have new neurologic symptoms or if headache is severe, or for any concerning symptom.   Provided education and documentation from American headache Society toolbox including articles on: chronic migraine medication overuse headache, chronic migraines, prevention of migraines, behavioral and other nonpharmacologic treatments for headache.:   Sarina Ill, MD  [facility] [address] [place], [place] [address]  Phone [phone] Fax [phone]  A total of 55 minutes was spent on this patient's care, reviewing imaging, past records, recent hospitalization notes and results. Over half this time was spent on counseling patient on the  1. Chronic migraine without aura, with intractable migraine, so stated, with status migrainosus     diagnosis and different diagnostic and therapeutic options, counseling and coordination of care, risks and benefitsof management, compliance, or risk factor reduction and education.

## 2019-07-23 ENCOUNTER — Ambulatory Visit: Payer: PPO | Admitting: Neurology

## 2019-07-23 ENCOUNTER — Encounter: Payer: Self-pay | Admitting: Neurology

## 2019-07-23 ENCOUNTER — Other Ambulatory Visit: Payer: Self-pay

## 2019-07-23 VITALS — BP 112/61 | HR 69 | Temp 97.9°F | Ht 61.75 in | Wt 120.0 lb

## 2019-07-23 DIAGNOSIS — G43711 Chronic migraine without aura, intractable, with status migrainosus: Secondary | ICD-10-CM | POA: Diagnosis not present

## 2019-07-23 MED ORDER — AJOVY 225 MG/1.5ML ~~LOC~~ SOAJ: 225.0000 mg | SUBCUTANEOUS | 11 refills | Status: AC

## 2019-07-23 MED ORDER — AJOVY 225 MG/1.5ML ~~LOC~~ SOAJ: 225.0000 mg | SUBCUTANEOUS | 0 refills | Status: AC

## 2019-07-23 NOTE — Patient Instructions (Signed)
Transformed migraines Red ear syndrome  Fremanezumab injection What is this medicine? FREMANEZUMAB (fre ma NEZ ue mab) is used to prevent migraine headaches. This medicine may be used for other purposes; ask your health care provider or pharmacist if you have questions. COMMON BRAND NAME(S): AJOVY What should I tell my health care provider before I take this medicine? They need to know if you have any of these conditions:  an unusual or allergic reaction to fremanezumab, other medicines, foods, dyes, or preservatives  pregnant or trying to get pregnant  breast-feeding How should I use this medicine? This medicine is for injection under the skin. You will be taught how to prepare and give this medicine. Use exactly as directed. Take your medicine at regular intervals. Do not take your medicine more often than directed. It is important that you put your used needles and syringes in a special sharps container. Do not put them in a trash can. If you do not have a sharps container, call your pharmacist or healthcare provider to get one. Talk to your pediatrician regarding the use of this medicine in children. Special care may be needed. Overdosage: If you think you have taken too much of this medicine contact a poison control center or emergency room at once. NOTE: This medicine is only for you. Do not share this medicine with others. What if I miss a dose? If you miss a dose, take it as soon as you can. If it is almost time for your next dose, take only that dose. Do not take double or extra doses. What may interact with this medicine? Interactions are not expected. This list may not describe all possible interactions. Give your health care provider a list of all the medicines, herbs, non-prescription drugs, or dietary supplements you use. Also tell them if you smoke, drink alcohol, or use illegal drugs. Some items may interact with your medicine. What should I watch for while using this  medicine? Tell your doctor or healthcare professional if your symptoms do not start to get better or if they get worse. What side effects may I notice from receiving this medicine? Side effects that you should report to your doctor or health care professional as soon as possible:  allergic reactions like skin rash, itching or hives, swelling of the face, lips, or tongue Side effects that usually do not require medical attention (report these to your doctor or health care professional if they continue or are bothersome):  pain, redness, or irritation at site where injected This list may not describe all possible side effects. Call your doctor for medical advice about side effects. You may report side effects to FDA at 1-800-FDA-1088. Where should I keep my medicine? Keep out of the reach of children. You will be instructed on how to store this medicine. Throw away any unused medicine after the expiration date on the label. NOTE: This sheet is a summary. It may not cover all possible information. If you have questions about this medicine, talk to your doctor, pharmacist, or health care provider.  2020 [employer] (2017-03-06 17:22:56)

## 2019-07-26 DIAGNOSIS — Z1231 Encounter for screening mammogram for malignant neoplasm of breast: Secondary | ICD-10-CM | POA: Diagnosis not present

## 2019-08-26 DIAGNOSIS — J343 Hypertrophy of nasal turbinates: Secondary | ICD-10-CM | POA: Diagnosis not present

## 2019-08-26 DIAGNOSIS — R519 Headache, unspecified: Secondary | ICD-10-CM | POA: Diagnosis not present

## 2019-08-26 DIAGNOSIS — J342 Deviated nasal septum: Secondary | ICD-10-CM | POA: Diagnosis not present

## 2019-08-26 DIAGNOSIS — J322 Chronic ethmoidal sinusitis: Secondary | ICD-10-CM | POA: Diagnosis not present

## 2019-08-26 DIAGNOSIS — G8929 Other chronic pain: Secondary | ICD-10-CM | POA: Diagnosis not present

## 2019-08-26 DIAGNOSIS — G44209 Tension-type headache, unspecified, not intractable: Secondary | ICD-10-CM | POA: Diagnosis not present

## 2019-12-17 DIAGNOSIS — L578 Other skin changes due to chronic exposure to nonionizing radiation: Secondary | ICD-10-CM | POA: Diagnosis not present

## 2019-12-17 DIAGNOSIS — Z85820 Personal history of malignant melanoma of skin: Secondary | ICD-10-CM | POA: Diagnosis not present

## 2019-12-17 DIAGNOSIS — D225 Melanocytic nevi of trunk: Secondary | ICD-10-CM | POA: Diagnosis not present

## 2020-03-23 DIAGNOSIS — R059 Cough, unspecified: Secondary | ICD-10-CM | POA: Diagnosis not present

## 2020-05-07 DIAGNOSIS — Z6821 Body mass index (BMI) 21.0-21.9, adult: Secondary | ICD-10-CM | POA: Diagnosis not present

## 2020-05-07 DIAGNOSIS — R059 Cough, unspecified: Secondary | ICD-10-CM | POA: Diagnosis not present

## 2020-05-07 DIAGNOSIS — J45901 Unspecified asthma with (acute) exacerbation: Secondary | ICD-10-CM | POA: Diagnosis not present

## 2020-05-18 DIAGNOSIS — M9903 Segmental and somatic dysfunction of lumbar region: Secondary | ICD-10-CM | POA: Diagnosis not present

## 2020-05-18 DIAGNOSIS — M5442 Lumbago with sciatica, left side: Secondary | ICD-10-CM | POA: Diagnosis not present

## 2020-05-18 DIAGNOSIS — M9904 Segmental and somatic dysfunction of sacral region: Secondary | ICD-10-CM | POA: Diagnosis not present

## 2020-05-18 DIAGNOSIS — S46912A Strain of unspecified muscle, fascia and tendon at shoulder and upper arm level, left arm, initial encounter: Secondary | ICD-10-CM | POA: Diagnosis not present

## 2020-05-18 DIAGNOSIS — M546 Pain in thoracic spine: Secondary | ICD-10-CM | POA: Diagnosis not present

## 2020-05-18 DIAGNOSIS — M5136 Other intervertebral disc degeneration, lumbar region: Secondary | ICD-10-CM | POA: Diagnosis not present

## 2020-05-18 DIAGNOSIS — M9902 Segmental and somatic dysfunction of thoracic region: Secondary | ICD-10-CM | POA: Diagnosis not present

## 2020-05-18 DIAGNOSIS — M25612 Stiffness of left shoulder, not elsewhere classified: Secondary | ICD-10-CM | POA: Diagnosis not present

## 2020-05-20 DIAGNOSIS — M9902 Segmental and somatic dysfunction of thoracic region: Secondary | ICD-10-CM | POA: Diagnosis not present

## 2020-05-20 DIAGNOSIS — M25612 Stiffness of left shoulder, not elsewhere classified: Secondary | ICD-10-CM | POA: Diagnosis not present

## 2020-05-20 DIAGNOSIS — M5442 Lumbago with sciatica, left side: Secondary | ICD-10-CM | POA: Diagnosis not present

## 2020-05-20 DIAGNOSIS — M546 Pain in thoracic spine: Secondary | ICD-10-CM | POA: Diagnosis not present

## 2020-05-20 DIAGNOSIS — M9903 Segmental and somatic dysfunction of lumbar region: Secondary | ICD-10-CM | POA: Diagnosis not present

## 2020-05-20 DIAGNOSIS — S46912A Strain of unspecified muscle, fascia and tendon at shoulder and upper arm level, left arm, initial encounter: Secondary | ICD-10-CM | POA: Diagnosis not present

## 2020-05-20 DIAGNOSIS — M5136 Other intervertebral disc degeneration, lumbar region: Secondary | ICD-10-CM | POA: Diagnosis not present

## 2020-05-20 DIAGNOSIS — M9904 Segmental and somatic dysfunction of sacral region: Secondary | ICD-10-CM | POA: Diagnosis not present

## 2020-06-26 DIAGNOSIS — Z0000 Encounter for general adult medical examination without abnormal findings: Secondary | ICD-10-CM | POA: Diagnosis not present

## 2020-06-26 DIAGNOSIS — Z139 Encounter for screening, unspecified: Secondary | ICD-10-CM | POA: Diagnosis not present

## 2020-06-26 DIAGNOSIS — Z1331 Encounter for screening for depression: Secondary | ICD-10-CM | POA: Diagnosis not present

## 2020-06-26 DIAGNOSIS — Z9181 History of falling: Secondary | ICD-10-CM | POA: Diagnosis not present

## 2020-07-28 DIAGNOSIS — E785 Hyperlipidemia, unspecified: Secondary | ICD-10-CM | POA: Diagnosis not present

## 2020-07-28 DIAGNOSIS — Z6821 Body mass index (BMI) 21.0-21.9, adult: Secondary | ICD-10-CM | POA: Diagnosis not present

## 2020-07-28 DIAGNOSIS — Z79899 Other long term (current) drug therapy: Secondary | ICD-10-CM | POA: Diagnosis not present

## 2020-07-28 DIAGNOSIS — E039 Hypothyroidism, unspecified: Secondary | ICD-10-CM | POA: Diagnosis not present

## 2020-07-28 DIAGNOSIS — M810 Age-related osteoporosis without current pathological fracture: Secondary | ICD-10-CM | POA: Diagnosis not present

## 2020-08-10 DIAGNOSIS — R202 Paresthesia of skin: Secondary | ICD-10-CM | POA: Diagnosis not present

## 2020-08-25 DIAGNOSIS — Z1231 Encounter for screening mammogram for malignant neoplasm of breast: Secondary | ICD-10-CM | POA: Diagnosis not present

## 2020-09-22 DIAGNOSIS — L300 Nummular dermatitis: Secondary | ICD-10-CM | POA: Diagnosis not present

## 2020-09-22 DIAGNOSIS — L820 Inflamed seborrheic keratosis: Secondary | ICD-10-CM | POA: Diagnosis not present

## 2020-09-22 DIAGNOSIS — D485 Neoplasm of uncertain behavior of skin: Secondary | ICD-10-CM | POA: Diagnosis not present

## 2020-09-25 DIAGNOSIS — M810 Age-related osteoporosis without current pathological fracture: Secondary | ICD-10-CM | POA: Diagnosis not present

## 2020-09-25 DIAGNOSIS — Z780 Asymptomatic menopausal state: Secondary | ICD-10-CM | POA: Diagnosis not present

## 2020-10-08 DIAGNOSIS — D485 Neoplasm of uncertain behavior of skin: Secondary | ICD-10-CM | POA: Diagnosis not present

## 2020-10-08 DIAGNOSIS — L308 Other specified dermatitis: Secondary | ICD-10-CM | POA: Diagnosis not present

## 2021-01-28 DIAGNOSIS — Z79899 Other long term (current) drug therapy: Secondary | ICD-10-CM | POA: Diagnosis not present

## 2021-01-28 DIAGNOSIS — E785 Hyperlipidemia, unspecified: Secondary | ICD-10-CM | POA: Diagnosis not present

## 2021-01-28 DIAGNOSIS — Z6821 Body mass index (BMI) 21.0-21.9, adult: Secondary | ICD-10-CM | POA: Diagnosis not present

## 2021-01-28 DIAGNOSIS — E039 Hypothyroidism, unspecified: Secondary | ICD-10-CM | POA: Diagnosis not present

## 2021-01-28 DIAGNOSIS — R232 Flushing: Secondary | ICD-10-CM | POA: Diagnosis not present

## 2021-01-28 DIAGNOSIS — R319 Hematuria, unspecified: Secondary | ICD-10-CM | POA: Diagnosis not present

## 2021-01-28 DIAGNOSIS — M810 Age-related osteoporosis without current pathological fracture: Secondary | ICD-10-CM | POA: Diagnosis not present

## 2021-03-18 DIAGNOSIS — M810 Age-related osteoporosis without current pathological fracture: Secondary | ICD-10-CM | POA: Diagnosis not present

## 2021-06-29 DIAGNOSIS — Z0000 Encounter for general adult medical examination without abnormal findings: Secondary | ICD-10-CM | POA: Diagnosis not present

## 2021-06-29 DIAGNOSIS — Z139 Encounter for screening, unspecified: Secondary | ICD-10-CM | POA: Diagnosis not present

## 2021-06-29 DIAGNOSIS — E785 Hyperlipidemia, unspecified: Secondary | ICD-10-CM | POA: Diagnosis not present

## 2021-06-29 DIAGNOSIS — Z9181 History of falling: Secondary | ICD-10-CM | POA: Diagnosis not present

## 2021-06-29 DIAGNOSIS — Z1331 Encounter for screening for depression: Secondary | ICD-10-CM | POA: Diagnosis not present

## 2021-08-05 DIAGNOSIS — Z6821 Body mass index (BMI) 21.0-21.9, adult: Secondary | ICD-10-CM | POA: Diagnosis not present

## 2021-08-05 DIAGNOSIS — E785 Hyperlipidemia, unspecified: Secondary | ICD-10-CM | POA: Diagnosis not present

## 2021-08-05 DIAGNOSIS — E039 Hypothyroidism, unspecified: Secondary | ICD-10-CM | POA: Diagnosis not present

## 2021-08-05 DIAGNOSIS — Z79899 Other long term (current) drug therapy: Secondary | ICD-10-CM | POA: Diagnosis not present

## 2021-08-05 DIAGNOSIS — M810 Age-related osteoporosis without current pathological fracture: Secondary | ICD-10-CM | POA: Diagnosis not present

## 2021-08-27 DIAGNOSIS — Z1231 Encounter for screening mammogram for malignant neoplasm of breast: Secondary | ICD-10-CM | POA: Diagnosis not present

## 2021-09-08 DIAGNOSIS — H5712 Ocular pain, left eye: Secondary | ICD-10-CM | POA: Diagnosis not present

## 2021-09-08 DIAGNOSIS — H10433 Chronic follicular conjunctivitis, bilateral: Secondary | ICD-10-CM | POA: Diagnosis not present

## 2021-09-14 DIAGNOSIS — L821 Other seborrheic keratosis: Secondary | ICD-10-CM | POA: Diagnosis not present

## 2021-09-14 DIAGNOSIS — L300 Nummular dermatitis: Secondary | ICD-10-CM | POA: Diagnosis not present

## 2021-09-14 DIAGNOSIS — Z85820 Personal history of malignant melanoma of skin: Secondary | ICD-10-CM | POA: Diagnosis not present

## 2021-09-14 DIAGNOSIS — L578 Other skin changes due to chronic exposure to nonionizing radiation: Secondary | ICD-10-CM | POA: Diagnosis not present

## 2021-10-15 DIAGNOSIS — M810 Age-related osteoporosis without current pathological fracture: Secondary | ICD-10-CM | POA: Diagnosis not present

## 2021-12-13 DIAGNOSIS — S8001XA Contusion of right knee, initial encounter: Secondary | ICD-10-CM | POA: Diagnosis not present

## 2021-12-23 DIAGNOSIS — M9903 Segmental and somatic dysfunction of lumbar region: Secondary | ICD-10-CM | POA: Diagnosis not present

## 2021-12-23 DIAGNOSIS — M5442 Lumbago with sciatica, left side: Secondary | ICD-10-CM | POA: Diagnosis not present

## 2021-12-23 DIAGNOSIS — M9904 Segmental and somatic dysfunction of sacral region: Secondary | ICD-10-CM | POA: Diagnosis not present

## 2021-12-23 DIAGNOSIS — M9902 Segmental and somatic dysfunction of thoracic region: Secondary | ICD-10-CM | POA: Diagnosis not present

## 2021-12-23 DIAGNOSIS — M25612 Stiffness of left shoulder, not elsewhere classified: Secondary | ICD-10-CM | POA: Diagnosis not present

## 2021-12-23 DIAGNOSIS — M5136 Other intervertebral disc degeneration, lumbar region: Secondary | ICD-10-CM | POA: Diagnosis not present

## 2021-12-23 DIAGNOSIS — M546 Pain in thoracic spine: Secondary | ICD-10-CM | POA: Diagnosis not present

## 2021-12-23 DIAGNOSIS — S46912A Strain of unspecified muscle, fascia and tendon at shoulder and upper arm level, left arm, initial encounter: Secondary | ICD-10-CM | POA: Diagnosis not present

## 2022-02-03 DIAGNOSIS — E785 Hyperlipidemia, unspecified: Secondary | ICD-10-CM | POA: Diagnosis not present

## 2022-02-03 DIAGNOSIS — M810 Age-related osteoporosis without current pathological fracture: Secondary | ICD-10-CM | POA: Diagnosis not present

## 2022-02-03 DIAGNOSIS — Z79899 Other long term (current) drug therapy: Secondary | ICD-10-CM | POA: Diagnosis not present

## 2022-02-03 DIAGNOSIS — E039 Hypothyroidism, unspecified: Secondary | ICD-10-CM | POA: Diagnosis not present

## 2022-02-03 DIAGNOSIS — Z6821 Body mass index (BMI) 21.0-21.9, adult: Secondary | ICD-10-CM | POA: Diagnosis not present

## 2022-04-21 DIAGNOSIS — M810 Age-related osteoporosis without current pathological fracture: Secondary | ICD-10-CM | POA: Diagnosis not present

## 2022-08-04 DIAGNOSIS — E785 Hyperlipidemia, unspecified: Secondary | ICD-10-CM | POA: Diagnosis not present

## 2022-08-04 DIAGNOSIS — Z1211 Encounter for screening for malignant neoplasm of colon: Secondary | ICD-10-CM | POA: Diagnosis not present

## 2022-08-04 DIAGNOSIS — Z6822 Body mass index (BMI) 22.0-22.9, adult: Secondary | ICD-10-CM | POA: Diagnosis not present

## 2022-08-04 DIAGNOSIS — Z79899 Other long term (current) drug therapy: Secondary | ICD-10-CM | POA: Diagnosis not present

## 2022-08-04 DIAGNOSIS — E039 Hypothyroidism, unspecified: Secondary | ICD-10-CM | POA: Diagnosis not present

## 2022-08-04 DIAGNOSIS — M810 Age-related osteoporosis without current pathological fracture: Secondary | ICD-10-CM | POA: Diagnosis not present

## 2022-09-13 DIAGNOSIS — L820 Inflamed seborrheic keratosis: Secondary | ICD-10-CM | POA: Diagnosis not present

## 2022-09-13 DIAGNOSIS — L821 Other seborrheic keratosis: Secondary | ICD-10-CM | POA: Diagnosis not present

## 2022-09-13 DIAGNOSIS — Z85820 Personal history of malignant melanoma of skin: Secondary | ICD-10-CM | POA: Diagnosis not present

## 2022-10-25 DIAGNOSIS — M810 Age-related osteoporosis without current pathological fracture: Secondary | ICD-10-CM | POA: Diagnosis not present

## 2022-11-08 DIAGNOSIS — Z1231 Encounter for screening mammogram for malignant neoplasm of breast: Secondary | ICD-10-CM | POA: Diagnosis not present

## 2022-11-30 DIAGNOSIS — K921 Melena: Secondary | ICD-10-CM | POA: Diagnosis not present

## 2022-12-19 DIAGNOSIS — K2970 Gastritis, unspecified, without bleeding: Secondary | ICD-10-CM | POA: Diagnosis not present

## 2022-12-19 DIAGNOSIS — K921 Melena: Secondary | ICD-10-CM | POA: Diagnosis not present

## 2022-12-19 DIAGNOSIS — K5730 Diverticulosis of large intestine without perforation or abscess without bleeding: Secondary | ICD-10-CM | POA: Diagnosis not present

## 2022-12-19 DIAGNOSIS — K317 Polyp of stomach and duodenum: Secondary | ICD-10-CM | POA: Diagnosis not present

## 2023-02-15 DIAGNOSIS — Z6821 Body mass index (BMI) 21.0-21.9, adult: Secondary | ICD-10-CM | POA: Diagnosis not present

## 2023-02-15 DIAGNOSIS — E039 Hypothyroidism, unspecified: Secondary | ICD-10-CM | POA: Diagnosis not present

## 2023-02-15 DIAGNOSIS — E785 Hyperlipidemia, unspecified: Secondary | ICD-10-CM | POA: Diagnosis not present

## 2023-02-15 DIAGNOSIS — M810 Age-related osteoporosis without current pathological fracture: Secondary | ICD-10-CM | POA: Diagnosis not present

## 2023-02-15 DIAGNOSIS — Z79899 Other long term (current) drug therapy: Secondary | ICD-10-CM | POA: Diagnosis not present

## 2023-03-28 DIAGNOSIS — M7062 Trochanteric bursitis, left hip: Secondary | ICD-10-CM | POA: Diagnosis not present

## 2023-05-08 DIAGNOSIS — M810 Age-related osteoporosis without current pathological fracture: Secondary | ICD-10-CM | POA: Diagnosis not present

## 2023-08-17 DIAGNOSIS — Z79899 Other long term (current) drug therapy: Secondary | ICD-10-CM | POA: Diagnosis not present

## 2023-08-17 DIAGNOSIS — G500 Trigeminal neuralgia: Secondary | ICD-10-CM | POA: Diagnosis not present

## 2023-08-17 DIAGNOSIS — E785 Hyperlipidemia, unspecified: Secondary | ICD-10-CM | POA: Diagnosis not present

## 2023-08-17 DIAGNOSIS — M810 Age-related osteoporosis without current pathological fracture: Secondary | ICD-10-CM | POA: Diagnosis not present

## 2023-08-17 DIAGNOSIS — Z6821 Body mass index (BMI) 21.0-21.9, adult: Secondary | ICD-10-CM | POA: Diagnosis not present

## 2023-08-17 DIAGNOSIS — E039 Hypothyroidism, unspecified: Secondary | ICD-10-CM | POA: Diagnosis not present

## 2023-08-23 DIAGNOSIS — M7061 Trochanteric bursitis, right hip: Secondary | ICD-10-CM | POA: Diagnosis not present

## 2023-08-23 DIAGNOSIS — M25551 Pain in right hip: Secondary | ICD-10-CM | POA: Diagnosis not present

## 2023-08-31 DIAGNOSIS — M810 Age-related osteoporosis without current pathological fracture: Secondary | ICD-10-CM | POA: Diagnosis not present

## 2023-09-11 DIAGNOSIS — Z79899 Other long term (current) drug therapy: Secondary | ICD-10-CM | POA: Diagnosis not present

## 2023-09-11 DIAGNOSIS — Z7901 Long term (current) use of anticoagulants: Secondary | ICD-10-CM | POA: Diagnosis not present

## 2023-09-11 DIAGNOSIS — R140 Abdominal distension (gaseous): Secondary | ICD-10-CM | POA: Diagnosis not present

## 2023-09-11 DIAGNOSIS — R1110 Vomiting, unspecified: Secondary | ICD-10-CM | POA: Diagnosis not present

## 2023-09-11 DIAGNOSIS — E785 Hyperlipidemia, unspecified: Secondary | ICD-10-CM | POA: Diagnosis not present

## 2023-09-11 DIAGNOSIS — I510 Cardiac septal defect, acquired: Secondary | ICD-10-CM | POA: Diagnosis not present

## 2023-09-11 DIAGNOSIS — E039 Hypothyroidism, unspecified: Secondary | ICD-10-CM | POA: Diagnosis not present

## 2023-09-11 DIAGNOSIS — Z13820 Encounter for screening for osteoporosis: Secondary | ICD-10-CM | POA: Diagnosis not present

## 2023-09-11 DIAGNOSIS — R110 Nausea: Secondary | ICD-10-CM | POA: Diagnosis not present

## 2023-09-11 DIAGNOSIS — R079 Chest pain, unspecified: Secondary | ICD-10-CM | POA: Diagnosis not present

## 2023-09-11 DIAGNOSIS — R0602 Shortness of breath: Secondary | ICD-10-CM | POA: Diagnosis not present

## 2023-09-11 DIAGNOSIS — M810 Age-related osteoporosis without current pathological fracture: Secondary | ICD-10-CM | POA: Diagnosis not present

## 2023-09-11 DIAGNOSIS — K219 Gastro-esophageal reflux disease without esophagitis: Secondary | ICD-10-CM | POA: Diagnosis not present

## 2023-09-11 DIAGNOSIS — I361 Nonrheumatic tricuspid (valve) insufficiency: Secondary | ICD-10-CM | POA: Diagnosis not present

## 2023-09-11 DIAGNOSIS — I340 Nonrheumatic mitral (valve) insufficiency: Secondary | ICD-10-CM | POA: Diagnosis not present

## 2023-09-11 DIAGNOSIS — R9431 Abnormal electrocardiogram [ECG] [EKG]: Secondary | ICD-10-CM | POA: Diagnosis not present

## 2023-09-11 DIAGNOSIS — I4891 Unspecified atrial fibrillation: Secondary | ICD-10-CM | POA: Diagnosis not present

## 2023-09-12 DIAGNOSIS — R079 Chest pain, unspecified: Secondary | ICD-10-CM | POA: Diagnosis not present

## 2023-09-12 DIAGNOSIS — E039 Hypothyroidism, unspecified: Secondary | ICD-10-CM | POA: Diagnosis not present

## 2023-09-12 DIAGNOSIS — I361 Nonrheumatic tricuspid (valve) insufficiency: Secondary | ICD-10-CM | POA: Diagnosis not present

## 2023-09-12 DIAGNOSIS — E785 Hyperlipidemia, unspecified: Secondary | ICD-10-CM | POA: Diagnosis not present

## 2023-09-12 DIAGNOSIS — R110 Nausea: Secondary | ICD-10-CM | POA: Diagnosis not present

## 2023-09-12 DIAGNOSIS — I340 Nonrheumatic mitral (valve) insufficiency: Secondary | ICD-10-CM | POA: Diagnosis not present

## 2023-09-12 DIAGNOSIS — R140 Abdominal distension (gaseous): Secondary | ICD-10-CM | POA: Diagnosis not present

## 2023-09-12 DIAGNOSIS — I4891 Unspecified atrial fibrillation: Secondary | ICD-10-CM | POA: Diagnosis not present

## 2023-09-13 DIAGNOSIS — R109 Unspecified abdominal pain: Secondary | ICD-10-CM | POA: Diagnosis not present

## 2023-09-15 DIAGNOSIS — K8020 Calculus of gallbladder without cholecystitis without obstruction: Secondary | ICD-10-CM | POA: Diagnosis not present

## 2023-09-15 DIAGNOSIS — I4891 Unspecified atrial fibrillation: Secondary | ICD-10-CM | POA: Diagnosis not present

## 2023-09-15 DIAGNOSIS — Z6821 Body mass index (BMI) 21.0-21.9, adult: Secondary | ICD-10-CM | POA: Diagnosis not present

## 2023-09-15 DIAGNOSIS — Z7901 Long term (current) use of anticoagulants: Secondary | ICD-10-CM | POA: Diagnosis not present

## 2023-09-18 DIAGNOSIS — L578 Other skin changes due to chronic exposure to nonionizing radiation: Secondary | ICD-10-CM | POA: Diagnosis not present

## 2023-09-18 DIAGNOSIS — L820 Inflamed seborrheic keratosis: Secondary | ICD-10-CM | POA: Diagnosis not present

## 2023-09-18 DIAGNOSIS — L821 Other seborrheic keratosis: Secondary | ICD-10-CM | POA: Diagnosis not present

## 2023-09-18 DIAGNOSIS — Z85820 Personal history of malignant melanoma of skin: Secondary | ICD-10-CM | POA: Diagnosis not present

## 2023-09-26 DIAGNOSIS — R030 Elevated blood-pressure reading, without diagnosis of hypertension: Secondary | ICD-10-CM | POA: Diagnosis not present

## 2023-09-26 DIAGNOSIS — I480 Paroxysmal atrial fibrillation: Secondary | ICD-10-CM | POA: Diagnosis not present

## 2023-10-24 DIAGNOSIS — I4891 Unspecified atrial fibrillation: Secondary | ICD-10-CM | POA: Diagnosis not present

## 2023-10-24 DIAGNOSIS — I480 Paroxysmal atrial fibrillation: Secondary | ICD-10-CM | POA: Diagnosis not present

## 2023-10-31 DIAGNOSIS — Z6821 Body mass index (BMI) 21.0-21.9, adult: Secondary | ICD-10-CM | POA: Diagnosis not present

## 2023-10-31 DIAGNOSIS — Z7901 Long term (current) use of anticoagulants: Secondary | ICD-10-CM | POA: Diagnosis not present

## 2023-10-31 DIAGNOSIS — I4891 Unspecified atrial fibrillation: Secondary | ICD-10-CM | POA: Diagnosis not present

## 2023-11-10 DIAGNOSIS — M810 Age-related osteoporosis without current pathological fracture: Secondary | ICD-10-CM | POA: Diagnosis not present

## 2023-11-16 DIAGNOSIS — I4729 Other ventricular tachycardia: Secondary | ICD-10-CM | POA: Diagnosis not present

## 2023-11-16 DIAGNOSIS — I480 Paroxysmal atrial fibrillation: Secondary | ICD-10-CM | POA: Diagnosis not present

## 2023-12-06 DIAGNOSIS — M7061 Trochanteric bursitis, right hip: Secondary | ICD-10-CM | POA: Diagnosis not present

## 2024-01-15 ENCOUNTER — Other Ambulatory Visit (HOSPITAL_BASED_OUTPATIENT_CLINIC_OR_DEPARTMENT_OTHER): Payer: Self-pay | Admitting: Orthopedic Surgery

## 2024-01-15 DIAGNOSIS — M25562 Pain in left knee: Secondary | ICD-10-CM

## 2024-01-15 DIAGNOSIS — M25362 Other instability, left knee: Secondary | ICD-10-CM | POA: Diagnosis not present

## 2024-01-15 DIAGNOSIS — S8002XA Contusion of left knee, initial encounter: Secondary | ICD-10-CM | POA: Diagnosis not present

## 2024-01-18 DIAGNOSIS — M25562 Pain in left knee: Secondary | ICD-10-CM | POA: Diagnosis not present

## 2024-01-18 DIAGNOSIS — Z6821 Body mass index (BMI) 21.0-21.9, adult: Secondary | ICD-10-CM | POA: Diagnosis not present

## 2024-01-21 ENCOUNTER — Ambulatory Visit (HOSPITAL_BASED_OUTPATIENT_CLINIC_OR_DEPARTMENT_OTHER)

## 2024-01-21 ENCOUNTER — Encounter (HOSPITAL_BASED_OUTPATIENT_CLINIC_OR_DEPARTMENT_OTHER): Payer: Self-pay

## 2024-01-22 DIAGNOSIS — I4729 Other ventricular tachycardia: Secondary | ICD-10-CM | POA: Diagnosis not present

## 2024-01-22 DIAGNOSIS — I480 Paroxysmal atrial fibrillation: Secondary | ICD-10-CM | POA: Diagnosis not present

## 2024-01-30 DIAGNOSIS — W228XXA Striking against or struck by other objects, initial encounter: Secondary | ICD-10-CM | POA: Diagnosis not present

## 2024-01-30 DIAGNOSIS — R519 Headache, unspecified: Secondary | ICD-10-CM | POA: Diagnosis not present

## 2024-01-30 DIAGNOSIS — S0990XA Unspecified injury of head, initial encounter: Secondary | ICD-10-CM | POA: Diagnosis not present

## 2024-02-01 DIAGNOSIS — H524 Presbyopia: Secondary | ICD-10-CM | POA: Diagnosis not present

## 2024-02-01 DIAGNOSIS — Z961 Presence of intraocular lens: Secondary | ICD-10-CM | POA: Diagnosis not present

## 2024-02-13 DIAGNOSIS — E039 Hypothyroidism, unspecified: Secondary | ICD-10-CM | POA: Diagnosis not present

## 2024-02-13 DIAGNOSIS — Z79899 Other long term (current) drug therapy: Secondary | ICD-10-CM | POA: Diagnosis not present

## 2024-02-13 DIAGNOSIS — Z7901 Long term (current) use of anticoagulants: Secondary | ICD-10-CM | POA: Diagnosis not present

## 2024-02-13 DIAGNOSIS — I4891 Unspecified atrial fibrillation: Secondary | ICD-10-CM | POA: Diagnosis not present

## 2024-02-13 DIAGNOSIS — Z6821 Body mass index (BMI) 21.0-21.9, adult: Secondary | ICD-10-CM | POA: Diagnosis not present

## 2024-02-13 DIAGNOSIS — E785 Hyperlipidemia, unspecified: Secondary | ICD-10-CM | POA: Diagnosis not present

## 2024-04-25 DIAGNOSIS — I4729 Other ventricular tachycardia: Secondary | ICD-10-CM | POA: Diagnosis not present

## 2024-04-25 DIAGNOSIS — I4891 Unspecified atrial fibrillation: Secondary | ICD-10-CM | POA: Diagnosis not present

## 2024-04-26 DIAGNOSIS — I4891 Unspecified atrial fibrillation: Secondary | ICD-10-CM | POA: Diagnosis not present

## 2024-05-15 DIAGNOSIS — Z79899 Other long term (current) drug therapy: Secondary | ICD-10-CM | POA: Diagnosis not present

## 2024-05-21 DIAGNOSIS — I4891 Unspecified atrial fibrillation: Secondary | ICD-10-CM | POA: Diagnosis not present

## 2024-05-21 DIAGNOSIS — I4729 Other ventricular tachycardia: Secondary | ICD-10-CM | POA: Diagnosis not present
